# Patient Record
Sex: Male | Born: 1965 | Race: White | Hispanic: No | Smoking: Never smoker
Health system: Southern US, Community
[De-identification: ages and names within clinical notes are randomized; demographics above are authoritative.]

## PROBLEM LIST (undated history)

## (undated) DIAGNOSIS — Z8719 Personal history of other diseases of the digestive system: Secondary | ICD-10-CM

## (undated) DIAGNOSIS — I10 Essential (primary) hypertension: Secondary | ICD-10-CM

## (undated) DIAGNOSIS — K219 Gastro-esophageal reflux disease without esophagitis: Secondary | ICD-10-CM

## (undated) DIAGNOSIS — E785 Hyperlipidemia, unspecified: Secondary | ICD-10-CM

## (undated) DIAGNOSIS — I4892 Unspecified atrial flutter: Secondary | ICD-10-CM

## (undated) DIAGNOSIS — E669 Obesity, unspecified: Secondary | ICD-10-CM

## (undated) DIAGNOSIS — M1990 Unspecified osteoarthritis, unspecified site: Secondary | ICD-10-CM

## (undated) HISTORY — DX: Obesity, unspecified: E66.9

## (undated) HISTORY — DX: Unspecified atrial flutter: I48.92

## (undated) HISTORY — DX: Hyperlipidemia, unspecified: E78.5

---

## 2005-05-01 ENCOUNTER — Inpatient Hospital Stay (HOSPITAL_COMMUNITY): Admission: EM | Admit: 2005-05-01 | Discharge: 2005-05-03 | Payer: Self-pay | Admitting: Emergency Medicine

## 2005-05-06 ENCOUNTER — Ambulatory Visit (HOSPITAL_COMMUNITY): Admission: RE | Admit: 2005-05-06 | Discharge: 2005-05-06 | Payer: Self-pay | Admitting: Cardiovascular Disease

## 2006-06-03 ENCOUNTER — Encounter: Admission: RE | Admit: 2006-06-03 | Discharge: 2006-06-03 | Payer: Self-pay | Admitting: Internal Medicine

## 2006-06-04 ENCOUNTER — Encounter: Admission: RE | Admit: 2006-06-04 | Discharge: 2006-06-04 | Payer: Self-pay | Admitting: Internal Medicine

## 2008-05-25 ENCOUNTER — Ambulatory Visit: Payer: Self-pay | Admitting: Internal Medicine

## 2009-07-05 ENCOUNTER — Ambulatory Visit: Payer: Self-pay | Admitting: Internal Medicine

## 2009-08-23 ENCOUNTER — Ambulatory Visit: Payer: Self-pay | Admitting: Internal Medicine

## 2010-01-09 ENCOUNTER — Ambulatory Visit: Payer: Self-pay | Admitting: Internal Medicine

## 2010-09-29 NOTE — H&P (Signed)
[name], John Garcia NO.:  [id number]   MEDICAL RECORD NO.:  [id number]          PATIENT TYPE:  EMS   LOCATION:  MAJO                         FACILITY:  [facility]   PHYSICIAN:  Ulyses Amor, MD DATE OF BIRTH:  03-23-1966   DATE OF ADMISSION:  04/30/2005  DATE OF DISCHARGE:                                HISTORY & PHYSICAL   INCOMPLETE DICTATION:   HISTORY OF PRESENT ILLNESS:  John Garcia is a 45 year old white man who  is admitted to [facility] for the further management of new onset  atrial fibrillation/atrial flutter.   The patient, who has no past history of cardiac disease, experienced the  sudden onset of a rapid and irregular heart beat at approximately 5 p.m.  today.  There was no associated chest pain, tightness, heaviness, pressure  or dyspnea, nor was there any dizziness, lightheadedness, syncope, or near  syncope.  He ultimately presented to the emergency department where he was  found to be in atrial flutter/atrial fibrillation with a rapid ventricular  rate.  Treatment was initiated with intravenous Diltiazem.  At this time, he  remains in atrial fibrillation, but his ventricular rate is in the 80s.   As noted, the patient has no past history of cardiac disease including no  history of myocardial infarction, coronary artery disease, congestive heart  failure, or arrhythmia.  He has occasional   Dictation ended at this point.      Ulyses Amor, MD     MSC/MEDQ  D:  04/30/2005  T:  05/01/2005  Job:  [id number]

## 2010-09-29 NOTE — Cardiovascular Report (Signed)
NAMESTEPHAUN, John Garcia             ACCOUNT NO.:  [id number]   MEDICAL RECORD NO.:  [id number]          PATIENT TYPE:  INP   LOCATION:  4737                         FACILITY:  [facility]   PHYSICIAN:  Darlin Priestly, MD  DATE OF BIRTH:  11-Dec-1965   DATE OF PROCEDURE:  05/02/2005  DATE OF DISCHARGE:                              CARDIAC CATHETERIZATION   PROCEDURES:  1.  Left heart catheterization.  2.  Coronary angiography.  3.  Left ventriculogram.  4.  Abdominal aortogram.   ATTENDING:  Darlin Priestly, M.D.   COMPLICATIONS:  None.   INDICATIONS:  Mr. Cooprider is a 45 year old male who was admitted on April 30, 2005, with new-onset atrial fibrillation.  He did have rapid ventricular  response, treated with IV Cardizem, though he did have some three and four-  second pauses.  He is now brought for cardiac catheterization to rule out  CAD prior to initiation of Coumadin therapy.   DESCRIPTION OF PROCEDURE:  After giving informed written consent, the  patient was brought to the cardiac catheterization lab.  The right groin was  shaved, prepped and draped in the usual sterile fashion.  Anesthesia  monitoring was established.  Using modified Seldinger technique, a #6 French  arterial sheath via the right femoral artery.  Six French diagnostic  catheters then performed diagnostic angiography.   1.  The left main is a large vessel with no significant disease.  2.  The LAD is a medium to large-sized vessel which coursed to the apex with      its two diagonal branches.  The LAD has no significant disease.  3.  First and second diagonals are medium-sized vessels with no significant      disease.  4.  Left circumflex is a medium-sized vessel that coursed in the AV groove      and gives rise to two obtuse marginal branches.  The AV groove      circumflex has no significant disease.  5.  The first OM is a medium-sized vessel with no significant disease.  6.  The second OM is a large  vessel which bifurcates in its proximal      segment, with no significant disease.  7.  The right coronary artery is a large vessel which is dominant and gives      rise to a PDA as well as a posterolateral branch.  There is no      significant disease in the RCA, PDA or posterolateral branch.   Left ventriculogram reveals a mildly depressed EF of 40-45% with mild global  hypokinesis.  The patient is in rapid atrial fibrillation.   Abdominal aortogram reveals no evidence of significant renal artery  stenosis.   HEMODYNAMICS:  Systemic arterial pressure 130/90, LV systemic pressure  134/6, LVEDP of 15.   CONCLUSION:  1.  No significant coronary artery disease.  2.  Mildly depressed left ventricular systolic function.  3.  No evidence of renal artery stenosis.  4.  Systemic hypertension.      Darlin Priestly, MD  Electronically Signed     RHM/MEDQ  D:  05/02/2005  T:  05/04/2005  Job:  [id number]   cc:   Nanetta Batty, M.D.  Fax: [phone]

## 2010-09-29 NOTE — Discharge Summary (Signed)
John Garcia, John Garcia             ACCOUNT NO.:  [id number]   MEDICAL RECORD NO.:  [id number]          PATIENT TYPE:  INP   LOCATION:  4737                         FACILITY:  [facility]   PHYSICIAN:  Nanetta Batty, M.D.   DATE OF BIRTH:  02-21-1966   DATE OF ADMISSION:  04/30/2005  DATE OF DISCHARGE:                                 DISCHARGE SUMMARY   DISCHARGE DIAGNOSIS:  1.  Paroxysmal atrial flutter, sinus rhythm at discharge.  2.  Family history of coronary disease, normal coronaries at catheterization      this admission.  3.  Mild LV dysfunction with an EF of 45%.   HOSPITAL COURSE:  The patient is a 45 year old male who was admitted by Dr.  Effie Shy with tachycardia. He apparently has had fatigue and tachycardia for  the last month or so. He admits to a high caffeine intake, 3 Mountain Dew's  a day and recently has been taking over-the-counter decongestants. On  admission he was in rapid atrial flutter. He is admitted to telemetry, put  on IV heparin, started on rate control medications, including a beta blocker  and Cardizem. He did have some pauses of up to 4.3 seconds; and we had to  titrate his Cardizem down some. He does have a strong family history of  coronary disease, his father had an MI at an early age. Before he was placed  on Coumadin, Dr. Allyson Sabal want to a diagnostic catheterization; and this was  done on 05/02/2005 by Dr. Jenne Campus. This revealed normal coronaries with an  EF of 45%. Coumadin was started. We were going to do a TE cardioversion on  05/04/2005, but the patient converted spontaneously to sinus rhythm on the  evening of to 05/02/2005. We feel he can be discharged on 05/03/2005.   DISCHARGE MEDICATIONS:  1.  Lovenox 120 mg twice a day.  2.  BuSpar 10 mg twice a day as needed for nerves.  3.  Cardizem CD 180 daily.  4.  Prilosec over-the-counter daily.  5.  Coumadin 5 mg 1-1/2 tablets a day or as directed.  6.  Metoprolol 25 mg twice a day.   LABS:   Sodium 139, potassium 3.9, BUN 10, creatinine 1.0, INR 1.0 at  discharge. White count 7.8, hemoglobin 15.8, hematocrit 45.2, platelets 201.  Liver functions were normal. Enzymes were negative. TSH is 2.18.   CHEST X-RAY:  Shows no active disease. EKG on admission shows atrial  flutter.   DISPOSITION:  The patient is discharged in stable condition. He will have a  ProTime checked Sunday; and that will be called to the on call PA. He will  need an INR goal of 2-3. 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  Dr. Alanda Amass felt that the patient's atrial flutter could be attributed to  excess caffeine intake; and over-the-counter decongestant intake. Hopefully  he will be able to come off Coumadin at some point in the future. This will  be determined by Dr. Allyson Sabal.      Abelino Derrick, P.A.  Nanetta Batty, M.D.  Electronically Signed    LKK/MEDQ  D:  05/03/2005  T:  05/05/2005  Job:  [id number]

## 2010-09-29 NOTE — H&P (Signed)
John Garcia, HEARNE NO.:  [id number]   MEDICAL RECORD NO.:  [id number]          PATIENT TYPE:  EMS   LOCATION:  MAJO                         FACILITY:  [facility]   PHYSICIAN:  Ulyses Amor, MD DATE OF BIRTH:  November 29, 1965   DATE OF ADMISSION:  04/30/2005  DATE OF DISCHARGE:                                HISTORY & PHYSICAL   Isador Castille is a 45 year old white man who is admitted to [facility] for the further management of new onset atrial fibrillation and  atrial flutter.   The patient, who has no past history of cardiac disease, developed the  sudden onset of a rapid and irregular heartbeat at approximately 5:00 p.m.  He was at rest at the time. There was no associated dyspnea, diaphoresis,  dizziness, lightheadedness, syncope, or near syncope. Nor was there any  chest pain, tightness, heaviness, pressure, or squeezing. He ultimately  presented to the emergency department where he was found to be in atrial  fibrillation/atrial flutter with a rapid ventricular rate. Treatment was  initiated with Diltiazem IV and his heart rate had subsequently dropped to  the 80s so he remains in atrial fibrillation.   As noted, the patient has no past history of cardiac disease, including no  history of myocardial infarction, coronary artery disease, congestive heart  failure, or arrhythmia. He does have occasional but rare fleeting sharp  pains in his left anterior chest. He mentioned this to his primary care  physician but no further evaluation was performed. These episodes occur at  random and appear not to be related to position, activity, meals or  respiration. There were no exacerbating or ameliorating factors.   The patient has no risk factors for coronary artery disease, including no  history of hypertension, dyslipidemia, diabetes mellitus, smoking, or family  history of early coronary artery disease.   The patient's past medical history is otherwise  unremarkable.   MEDICATIONS:  None.   ALLERGIES:  None.   OPERATIONS:  None.   SIGNIFICANT INJURIES:  None.   Family history is notable for diabetes and hypertension in his mother.   SOCIAL:  The patient lives with his wife. He works Programmer, applications. He  drinks occasional alcohol. He smokes no cigarettes. He uses no illicit  drugs.   Review of systems reveals no new problems. No problems related to his head,  eyes, ears, nose, mouth, throat, lungs, gastrointestinal system,  genitourinary system or extremities. There is no history of neurologic or  psychiatric disorder. There is no history of fever, chills, or weight loss.   PHYSICAL EXAMINATION:  Blood pressure 126/72, pulse 79 and irregularly  irregular, respirations 26, temperature 97.8, pulse ox 99% on room air. The  patient was an obese, middle-aged white man in no discomfort. He was alert,  oriented, appropriate and responsive.  HEAD, EYES, NOSE, MOUTH: Normal.  NECK: Without thyromegaly or adenopathy. Carotid pulses were palpable  bilaterally and without bruits.  CARDIAC: Revealed an irregularly irregular rhythm. There was no gallop,  murmur, rub or click. No chest wall tenderness was noted.  LUNGS: Clear.  ABDOMEN: Soft  and nontender. There was no mass, hepatosplenomegaly, bruit,  distention, rebound, guarding, rigidity. Bowel sounds were normal.  RECTAL/GENITAL: Not performed as they were not pertinent to the reason for  acute care hospitalization.  EXTREMITIES: Without edema, deviation, or deformity. Radial and dorsalis  pedal pulses were palpable bilaterally.  NEUROLOGIC: Brief screening neurologic survey was unremarkable.   The initial electrocardiograms revealed atrial flutter with 2:1 AV  conduction and a ventricular rate of 140 beats per minute. Subsequent  telemetry monitoring revealed atrial fibrillation. The chest radiograph,  according to the radiologist, demonstrated borderline cardiomegaly, mild to   moderate bronchitic changes, and mild chronic interstitial lung disease.  Mild interstitial lung disease. The initial set of cardiac markers revealed  the myoglobin of 54.3, CK-MB 1.2 and troponin less than 0.05. The second set  of cardiac markers revealed myoglobin of 55.6, CK-MB less than 1 and  troponin less than 0.05. Potassium is 3.6, BUN 12, and creatinine 0.9. The  remaining studies were pending at the time of this dictation.   IMPRESSION:  1.  New onset atrial fibrillation and atrial flutter with rapid ventricular      rate.  2.  Chest pain.  3.  Chronic interstitial lung changes.   PLAN:  1.  Telemetry.  2.  Serial cardiac enzymes.  3.  Aspirin.  4.  Intravenous heparin.  5.  Intravenous diltiazem.  6.  Thyroid function tests.  7.  Fasting lipid profile.  8.  Echocardiogram.  9.  Further measures per Dr. Tresa Endo.      Ulyses Amor, MD  Electronically Signed     MSC/MEDQ  D:  04/30/2005  T:  05/01/2005  Job:  [id number]   cc:   Ulyses Amor, MD  Fax: [phone]   Nicki Guadalajara, M.D.  Fax: [phone]

## 2011-01-05 ENCOUNTER — Encounter: Payer: Self-pay | Admitting: Internal Medicine

## 2011-01-09 ENCOUNTER — Encounter: Payer: Self-pay | Admitting: Internal Medicine

## 2011-01-09 ENCOUNTER — Ambulatory Visit (INDEPENDENT_AMBULATORY_CARE_PROVIDER_SITE_OTHER): Payer: BC Managed Care – PPO | Admitting: Internal Medicine

## 2011-01-09 VITALS — BP 122/76 | HR 76 | Temp 97.8°F | Ht 75.0 in | Wt 247.0 lb

## 2011-01-09 DIAGNOSIS — Z0000 Encounter for general adult medical examination without abnormal findings: Secondary | ICD-10-CM

## 2011-01-09 DIAGNOSIS — E785 Hyperlipidemia, unspecified: Secondary | ICD-10-CM

## 2011-01-09 LAB — POCT URINALYSIS DIPSTICK
Bilirubin, UA: NEGATIVE
Blood, UA: NEGATIVE
Ketones, UA: NEGATIVE
Leukocytes, UA: NEGATIVE
Spec Grav, UA: 1.015
pH, UA: 5

## 2011-01-10 ENCOUNTER — Encounter: Payer: Self-pay | Admitting: Internal Medicine

## 2011-01-10 DIAGNOSIS — E785 Hyperlipidemia, unspecified: Secondary | ICD-10-CM | POA: Insufficient documentation

## 2011-01-10 LAB — CBC WITH DIFFERENTIAL/PLATELET
Basophils Absolute: 0 10*3/uL (ref 0.0–0.1)
Eosinophils Absolute: 0.1 10*3/uL (ref 0.0–0.7)
Eosinophils Relative: 2 % (ref 0–5)
MCH: 32.1 pg (ref 26.0–34.0)
MCV: 98.4 fL (ref 78.0–100.0)
Neutrophils Relative %: 57 % (ref 43–77)
Platelets: 174 10*3/uL (ref 150–400)
RDW: 12.8 % (ref 11.5–15.5)
WBC: 6.3 10*3/uL (ref 4.0–10.5)

## 2011-01-10 LAB — COMPREHENSIVE METABOLIC PANEL
ALT: 27 U/L (ref 0–53)
AST: 21 U/L (ref 0–37)
Alkaline Phosphatase: 73 U/L (ref 39–117)
Creat: 0.83 mg/dL (ref 0.50–1.35)
Sodium: 142 mEq/L (ref 135–145)
Total Bilirubin: 0.5 mg/dL (ref 0.3–1.2)

## 2011-01-10 LAB — LIPID PANEL
Cholesterol: 268 mg/dL — ABNORMAL HIGH (ref 0–200)
HDL: 47 mg/dL (ref >39)
Total CHOL/HDL Ratio: 5.7 Ratio
VLDL: 37 mg/dL (ref 0–40)

## 2011-01-10 NOTE — Progress Notes (Signed)
Subjective:    Patient ID: John Garcia, male    DOB: [birth year], 45 y.o.   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 Last physical exam April 2011. He has a history of hyperlipidemia used to take Crestor 10 mg daily but he says it made his legs hurt. In February 2011 he was going through a separation and has been subsequently divorced from his first wife. At that time he lost down to 229 pounds. He gained up to 233 pounds by April 2011 and now weighs 247 pounds. His father is morbidly obese. Mother is overweight as well.  Patient was admitted to the hospital December 2006 with new onset atrial fibrillation with rapid ventricular response treated with IV Cardizem with 3-4 second pauses. Patient subsequently converted with IV Cardizem. He had a cardiac catheterization which showed no significant coronary artery disease. He currently is not on any antiarrhythmic medication nor on Coumadin. He's never had a further episode.  History of left lower pneumonia 2008. 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  Social history: Does not , social alcohol consumption; divorced with 2 daughters oldest is 8 years old.  Family history: Mother with diabetes hypertension and hyperlipidemia; father with coronary artery disease, history of melanoma, hypertension, morbid obesity, sleep apnea. One sister in good health.    Review of Systems  Constitutional: Negative.   HENT: Negative.   Eyes: Negative.   Respiratory: Negative.   Cardiovascular: Negative.   Gastrointestinal: Negative.   Genitourinary: Negative.   Musculoskeletal: Negative.   Neurological: Negative.   Hematological: Negative.   Psychiatric/Behavioral: Negative.        Objective:   Physical Exam  Constitutional: He is oriented to person, place, and time. He appears well-nourished. No distress.  HENT:  Head: Normocephalic.  Right Ear:  External ear normal.  Left Ear: External ear normal.  Eyes: EOM are normal. Pupils are equal, round, and reactive to light. Right eye exhibits no discharge. Left eye exhibits no discharge. No scleral icterus.  Neck: Normal range of motion. Neck supple. No JVD present. No thyromegaly present.  Cardiovascular: Normal rate, regular rhythm and normal heart sounds.  Exam reveals no gallop.   No murmur heard. Pulmonary/Chest: No respiratory distress. He has no wheezes. He has no rales. He exhibits no tenderness.  Abdominal: Soft. Bowel sounds are normal. He exhibits no distension and no mass. There is no rebound and no guarding.  Genitourinary: Prostate normal.  Musculoskeletal: He exhibits no tenderness.  Lymphadenopathy:    He has no cervical adenopathy.  Neurological: He is alert and oriented to person, place, and time. He displays normal reflexes. No cranial nerve deficit. Coordination normal.  Skin: No rash noted.  Psychiatric: He has a normal mood and affect. His behavior is normal. Judgment and thought content normal.          Assessment & Plan:  Hyperlipidemia  Weight gain of 14 pounds since April 2011  Patient previously took Crestor 5 mg daily. I am suggested he try Crestor 5 mg daily since he complained that 10 mg made his legs hurt. He will return in 8 weeks or so to have fasting lipid panel and liver function strong without office visit. With his family history, think he is likely to need long-term statin therapy if he can tolerate it. Needs to have an EpiPen on hand for bee stings. This will be prescribed. Also there is a dilated vessel on his  right nose that looks like it runs into a small hemangioma. He has had this previously evaluated by Dr. Karlyn Agee. Says it bleeds intermittently. We will have Dr. Yetta Barre reevaluate this. He also needs a tetanus immunization update at next visit.

## 2011-01-12 ENCOUNTER — Telehealth: Payer: Self-pay | Admitting: Internal Medicine

## 2011-01-12 NOTE — Telephone Encounter (Signed)
Advised patient of appt with Dr. Karlyn Agee at [facility] on Wednesday, 01/24/11 at 12:20 p.m. Pt verbalized understanding.

## 2011-02-13 ENCOUNTER — Other Ambulatory Visit: Payer: BC Managed Care – PPO | Admitting: Internal Medicine

## 2011-02-13 ENCOUNTER — Ambulatory Visit (INDEPENDENT_AMBULATORY_CARE_PROVIDER_SITE_OTHER): Payer: BC Managed Care – PPO | Admitting: Internal Medicine

## 2011-02-13 DIAGNOSIS — Z23 Encounter for immunization: Secondary | ICD-10-CM

## 2011-02-13 DIAGNOSIS — E785 Hyperlipidemia, unspecified: Secondary | ICD-10-CM

## 2011-02-13 MED ORDER — TETANUS-DIPHTH-ACELL PERTUSSIS 5-2.5-18.5 LF-MCG/0.5 IM SUSP
0.5000 mL | Freq: Once | INTRAMUSCULAR | Status: AC
  Administered 2011-02-13: 0.5 mL via INTRAMUSCULAR

## 2011-02-14 LAB — LIPID PANEL
HDL: 52 mg/dL (ref >39)
LDL Cholesterol: 162 mg/dL — ABNORMAL HIGH (ref 0–99)
Total CHOL/HDL Ratio: 4.5 Ratio
VLDL: 20 mg/dL (ref 0–40)

## 2011-02-14 LAB — HEPATIC FUNCTION PANEL
AST: 16 U/L (ref 0–37)
Albumin: 4.5 g/dL (ref 3.5–5.2)
Total Bilirubin: 0.4 mg/dL (ref 0.3–1.2)

## 2011-02-15 ENCOUNTER — Encounter: Payer: Self-pay | Admitting: Internal Medicine

## 2011-04-17 ENCOUNTER — Ambulatory Visit
Admission: RE | Admit: 2011-04-17 | Discharge: 2011-04-17 | Disposition: A | Discharge status: Home / Self Care | Payer: BC Managed Care – PPO | Source: Ambulatory Visit | Attending: Internal Medicine | Admitting: Internal Medicine

## 2011-04-17 ENCOUNTER — Ambulatory Visit (INDEPENDENT_AMBULATORY_CARE_PROVIDER_SITE_OTHER): Payer: BC Managed Care – PPO | Admitting: Internal Medicine

## 2011-04-17 ENCOUNTER — Encounter: Payer: Self-pay | Admitting: Internal Medicine

## 2011-04-17 VITALS — BP 126/82 | HR 80 | Temp 100.4°F | Wt 247.0 lb

## 2011-04-17 DIAGNOSIS — R059 Cough, unspecified: Secondary | ICD-10-CM

## 2011-04-17 DIAGNOSIS — J40 Bronchitis, not specified as acute or chronic: Secondary | ICD-10-CM

## 2011-04-17 DIAGNOSIS — R05 Cough: Secondary | ICD-10-CM

## 2011-04-17 DIAGNOSIS — J111 Influenza due to unidentified influenza virus with other respiratory manifestations: Secondary | ICD-10-CM

## 2011-04-17 DIAGNOSIS — R509 Fever, unspecified: Secondary | ICD-10-CM

## 2011-04-17 LAB — CBC WITH DIFFERENTIAL/PLATELET
HCT: 47 % (ref 39.0–52.0)
Lymphocytes Relative: 16 % (ref 12–46)
Monocytes Absolute: 0.4 10*3/uL (ref 0.1–1.0)
Neutro Abs: 7.1 10*3/uL (ref 1.7–7.7)
Platelets: 140 10*3/uL — ABNORMAL LOW (ref 150–400)
RBC: 4.97 MIL/uL (ref 4.22–5.81)
RDW: 11.9 % (ref 11.5–15.5)
WBC: 8.9 10*3/uL (ref 4.0–10.5)

## 2011-04-17 MED ORDER — CEFTRIAXONE SODIUM 1 G IJ SOLR
1.0000 g | Freq: Once | INTRAMUSCULAR | Status: AC
  Administered 2011-04-17: 1 g via INTRAMUSCULAR

## 2011-04-17 NOTE — Progress Notes (Signed)
  Subjective:    Patient ID: John Garcia, male    DOB: [birth year], 45 y.o.   MRN: [id number]  HPI patient has cough, congestion, fever, myalgias, chills, malaise and fatigue. Has been symptomatic for 4 days. Has sputum production but doesn't know what color his sputum is. Has lack of appetite. Has tried to continue working despite being ill. 2 daughters have had some respiratory infection but not frank flu.    Review of Systems     Objective:   Physical Exam HEENT exam TMs are full and dull bilaterally, pharynx is slightly injected, neck is supple without significant adenopathy, chest: Has coarse breath sounds and some wheezing with deep inspiration left lower chest. Chest x-ray shows no infiltrate. CBC is within normal limits.        Assessment & Plan:  Clinically he has early pneumonia although it doesn't show up on chest x-ray. He definitely has bronchitis. He also has influenza. It is too late to prescribe Tamiflu for him since he's been ill for 4 days. Plan he was given 1 g IM Rocephin in the office. Started on Levaquin 750 mg daily for 10 days. Hycodan 8 ounces 1 teaspoon by mouth every 6 hours when necessary cough. Advised not to work for several days until he feels better. Drink plenty of fluids and stay well-hydrated. Return when necessary

## 2011-04-18 NOTE — Patient Instructions (Signed)
Take Levaquin 750 mg daily for 10 days. Take Hycodan as needed every 6 hours for cough. Drink plenty of fluids. Please try not to work over the next several days and get some rest. Call if symptoms worsen

## 2011-06-29 ENCOUNTER — Telehealth: Payer: Self-pay | Admitting: Internal Medicine

## 2011-06-29 MED ORDER — ROSUVASTATIN CALCIUM 10 MG PO TABS: 10.0000 mg | ORAL_TABLET | Freq: Every day | ORAL | Status: DC

## 2011-06-29 NOTE — Telephone Encounter (Signed)
rx for crestor refilled

## 2011-07-02 ENCOUNTER — Other Ambulatory Visit: Payer: BC Managed Care – PPO | Admitting: Internal Medicine

## 2011-07-03 ENCOUNTER — Ambulatory Visit: Payer: BC Managed Care – PPO | Admitting: Internal Medicine

## 2012-03-24 ENCOUNTER — Other Ambulatory Visit: Payer: Self-pay

## 2012-03-24 MED ORDER — ROSUVASTATIN CALCIUM 10 MG PO TABS: 10.0000 mg | ORAL_TABLET | Freq: Every day | ORAL | Status: DC

## 2012-04-07 ENCOUNTER — Other Ambulatory Visit: Payer: Self-pay | Admitting: Internal Medicine

## 2012-07-01 ENCOUNTER — Ambulatory Visit (INDEPENDENT_AMBULATORY_CARE_PROVIDER_SITE_OTHER): Payer: BC Managed Care – PPO | Admitting: Internal Medicine

## 2012-07-01 ENCOUNTER — Encounter: Payer: Self-pay | Admitting: Internal Medicine

## 2012-07-01 VITALS — BP 134/90 | HR 76 | Temp 97.7°F | Wt 259.0 lb

## 2012-07-01 DIAGNOSIS — J9801 Acute bronchospasm: Secondary | ICD-10-CM

## 2012-07-01 DIAGNOSIS — J209 Acute bronchitis, unspecified: Secondary | ICD-10-CM

## 2012-07-01 NOTE — Progress Notes (Signed)
  Subjective:    Patient ID: John Garcia, male    DOB: 1965/09/05, 47 y.o.   MRN: [id number]  HPI 47 year old white male with history of hyperlipidemia currently on Crestor in today with URI symptoms for several days. Yesterday had some wheezing and shortness of breath. Has had some cough with sputum production but he does not know the color of the sputum. No fever or shaking chills. One episode of bronchospasm associated with URI several years ago. He tried some leftover Amoxicillin twice daily for 5 days last week without much improvement. Did not take influenza immunization this season.  History of atrial fibrillation/flutter with rapid ventricular response December 2006 treated with IV Cardizem. A cardiac catheterization 05/02/2005 showing normal coronaries. Currently not on any medication for this and has never had a recurrence.  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  Family history: Mother with diabetes mellitus, hypertension, hyperlipidemia. Father with sleep apnea, hypertension, morbid obesity, coronary disease  Sister with hypertension  Social history: He is divorced. 2 daughters. He is a self-employed Leisure centre manager. Does not smoke. Social alcohol consumption.  Last physical exam 2012.    Review of Systems     Objective:   Physical Exam pulse oximetry on room air is within normal limits. HEENT exam: Pharynx slightly injected. TMs are chronically scarred but clear. Neck is supple without adenopathy. Chest clear without rales or wheezing. He has a deep congested cough. Skin is warm and dry.        Assessment & Plan:  Bronchitis  Bronchospasm  Hyperlipidemia-on Crestor schedule for physical exam in the near future  Plan: Sterapred DS 10 mg 6 day dosepak take as directed. Ventolin inhaler 2 sprays by mouth 4 times a day when necessary wheezing or shortness of breath. Biaxin 500 mg twice daily for 10 days with one refill. Has Hycodan syrup on hand for cough

## 2012-07-01 NOTE — Patient Instructions (Addendum)
Take Biaxin 500 mg twice daily for 10 days. Take prednisone tapering dosepak as prescribed for 6 days. Take Hycodan as needed for cough. Use Ventolin inhaler for shortness of breath or wheezing 4 times daily

## 2012-08-12 ENCOUNTER — Encounter: Payer: Self-pay | Admitting: Internal Medicine

## 2012-08-12 ENCOUNTER — Ambulatory Visit (INDEPENDENT_AMBULATORY_CARE_PROVIDER_SITE_OTHER): Payer: BC Managed Care – PPO | Admitting: Internal Medicine

## 2012-08-12 VITALS — BP 134/88 | HR 80 | Temp 98.4°F | Ht 64.75 in | Wt 258.0 lb

## 2012-08-12 DIAGNOSIS — S09302A Unspecified injury of left middle and inner ear, initial encounter: Secondary | ICD-10-CM

## 2012-08-12 DIAGNOSIS — E785 Hyperlipidemia, unspecified: Secondary | ICD-10-CM

## 2012-08-12 DIAGNOSIS — Z0000 Encounter for general adult medical examination without abnormal findings: Secondary | ICD-10-CM

## 2012-08-12 LAB — POCT URINALYSIS DIPSTICK
Bilirubin, UA: NEGATIVE
Blood, UA: NEGATIVE
Nitrite, UA: NEGATIVE
Urobilinogen, UA: NEGATIVE
pH, UA: 5.5

## 2012-08-12 LAB — COMPREHENSIVE METABOLIC PANEL
Alkaline Phosphatase: 81 U/L (ref 39–117)
BUN: 16 mg/dL (ref 6–23)
Glucose, Bld: 80 mg/dL (ref 70–99)
Total Bilirubin: 0.4 mg/dL (ref 0.3–1.2)

## 2012-08-12 LAB — LIPID PANEL
HDL: 56 mg/dL (ref >39)
LDL Cholesterol: 125 mg/dL — ABNORMAL HIGH (ref 0–99)
Triglycerides: 106 mg/dL (ref <150)
VLDL: 21 mg/dL (ref 0–40)

## 2012-08-12 LAB — CBC WITH DIFFERENTIAL/PLATELET
Basophils Relative: 0 % (ref 0–1)
Eosinophils Absolute: 0.1 10*3/uL (ref 0.0–0.7)
HCT: 45.8 % (ref 39.0–52.0)
Hemoglobin: 15.8 g/dL (ref 13.0–17.0)
MCH: 31.5 pg (ref 26.0–34.0)
MCHC: 34.5 g/dL (ref 30.0–36.0)
Monocytes Absolute: 0.5 10*3/uL (ref 0.1–1.0)
Monocytes Relative: 9 % (ref 3–12)

## 2012-08-12 NOTE — Patient Instructions (Addendum)
Take amoxicillin 500 mg 3 times daily for 10 days for eardrum injury. Apply Cortisporin Otic suspension and took left external ear canal 3-4 times daily for 5-7 days. Call if not better in 2 weeks. Continue Crestor for hyperlipidemia. Return in 6 months. Try to diet exercise and lose weight.

## 2013-02-03 ENCOUNTER — Encounter: Payer: Self-pay | Admitting: Internal Medicine

## 2013-02-03 NOTE — Progress Notes (Signed)
  Subjective:    Patient ID: John Garcia, male    DOB: 1966-04-07, 47 y.o.   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 In 2011, he was going through a separation and divorce.  At that time he lost down to 229 pounds. In 2012 he weighed 247 pounds. He's gained 11 pounds since then.  Patient was admitted to hospital December 2006 with new onset atrial fibrillation with rapid ventricular response treated with IV Cardizem. Patient subsequently converted to sinus rhythm. He had a cardiac catheterization that showed no significant coronary artery disease. He's never had a further episode  and is not on any antiarrhythmic medication.  History of left lower lobe pneumonia 2008.  He is allergic to bee stings.  Social history: Does not smoke, social alcohol consumption. Divorced with 2 daughters. Oldest daughter just finished high school . Youngest daughter is interested in barrel racing with her horse. Ex-wife is remarried. He has not remarried.  Family history: Mother with history of diabetes, hypertension, hyperlipidemia, obesity. Father with history of coronary artery disease, history of melanoma, hypertension, morbid obesity, sleep apnea. One sister with mild hypertension and migraine headache.  Recently he was riding a horse and some debris got into his right external ear canal. It is inflamed and tender.    Review of Systems  Constitutional: Negative.   HENT: Negative.   Eyes: Negative.   Respiratory: Negative.   Cardiovascular: Negative.   Gastrointestinal: Negative.   Endocrine: Negative.   Genitourinary: Negative.   Musculoskeletal: Positive for arthralgias.  Skin: Negative.   Hematological: Negative.   Psychiatric/Behavioral: Negative.        Objective:   Physical Exam  Vitals reviewed. Constitutional: He is oriented to person, place, and time. He appears well-developed and well-nourished. No distress.   HENT:  Head: Normocephalic and atraumatic.  Right Ear: External ear normal.  Small hemorrhage left tympanic membrane  Eyes: Conjunctivae and EOM are normal. Pupils are equal, round, and reactive to light. Right eye exhibits no discharge. Left eye exhibits no discharge. No scleral icterus.  Neck: Neck supple. No JVD present.  Cardiovascular: Normal rate, regular rhythm, normal heart sounds and intact distal pulses.   No murmur heard. Pulmonary/Chest: Effort normal and breath sounds normal. No respiratory distress. He has no wheezes. He has no rales. He exhibits no tenderness.  Abdominal: Soft. Bowel sounds are normal. He exhibits no distension. There is no tenderness. There is no rebound and no guarding.  Genitourinary: Prostate normal.  Musculoskeletal: He exhibits no edema.  Lymphadenopathy:    He has no cervical adenopathy.  Neurological: He is alert and oriented to person, place, and time. He has normal reflexes. No cranial nerve deficit.  Skin: Skin is warm and dry. No rash noted. He is not diaphoretic.  Psychiatric: He has a normal mood and affect. His behavior is normal. Judgment and thought content normal.          Assessment & Plan:  Hyperlipidemia-stable with statin medication  Obesity-family history of obesity. He needs to watch his weight.  History of atrial fibrillation one episode in 2006 with no further recurrence  Left ear drum trauma trauma-treat with Cortisporin Otic suspension for 5-7 days and amoxicillin 500 mg 3 times a day for 10 days  Plan: Return one year or as needed

## 2013-02-17 ENCOUNTER — Ambulatory Visit: Payer: BC Managed Care – PPO | Admitting: Internal Medicine

## 2013-03-10 ENCOUNTER — Ambulatory Visit (INDEPENDENT_AMBULATORY_CARE_PROVIDER_SITE_OTHER): Payer: BC Managed Care – PPO | Admitting: Internal Medicine

## 2013-03-10 ENCOUNTER — Encounter: Payer: Self-pay | Admitting: Internal Medicine

## 2013-03-10 VITALS — BP 110/78 | HR 80 | Temp 97.8°F | Ht 76.0 in | Wt 264.0 lb

## 2013-03-10 DIAGNOSIS — R079 Chest pain, unspecified: Secondary | ICD-10-CM

## 2013-03-10 DIAGNOSIS — M109 Gout, unspecified: Secondary | ICD-10-CM

## 2013-03-10 DIAGNOSIS — Z23 Encounter for immunization: Secondary | ICD-10-CM

## 2013-03-10 DIAGNOSIS — E785 Hyperlipidemia, unspecified: Secondary | ICD-10-CM

## 2013-03-10 DIAGNOSIS — Z79899 Other long term (current) drug therapy: Secondary | ICD-10-CM

## 2013-03-10 LAB — HEPATIC FUNCTION PANEL
Albumin: 4.3 g/dL (ref 3.5–5.2)
Alkaline Phosphatase: 77 U/L (ref 39–117)
Total Bilirubin: 0.5 mg/dL (ref 0.3–1.2)

## 2013-03-10 LAB — LIPID PANEL
HDL: 47 mg/dL (ref >39)
LDL Cholesterol: 78 mg/dL (ref 0–99)
VLDL: 34 mg/dL (ref 0–40)

## 2013-03-10 LAB — URIC ACID: Uric Acid, Serum: 6.9 mg/dL (ref 4.0–7.8)

## 2013-03-10 MED ORDER — ALLOPURINOL 300 MG PO TABS: 300.0000 mg | ORAL_TABLET | Freq: Every day | ORAL | Status: DC

## 2013-03-15 NOTE — Progress Notes (Signed)
  Subjective:    Patient ID: John Garcia, male    DOB: 1966-05-02, 47 y.o.   MRN: [id number]  HPI 47 year old male in today for followup on hyperlipidemia. He is on Crestor 10 mg daily. Fasting lipid panel and liver functions drawn. No complaints with side effects from Crestor. His been having some issues with swelling and redness of left foot first MTP joint. Thinks it may be gout. Plan to add uric acid to lab work. Still complaining of decreased hearing left ear. Also has had some left anterior chest pain. It was occurring at rest on one evening recently after a large meal. Did not radiate into neck or down left arm. No associated diaphoresis nausea or vomiting.    Review of Systems     Objective:   Physical Exam left TM chronically scarred but not red. Chest clear to auscultation. Cardiac exam regular rate and rhythm normal S1 and S2. Extremities without edema. Left foot first MTP joint slightly red but not swollen or hot to touch  EKG shows no acute changes      Assessment & Plan:  Hyperlipidemia-stable   Possible gout left foot first MTP joint  Probable noncardiac chest pain. If pain continues, refer back to cardiologist  Plan: Check uric acid level in addition to fasting lipid panel liver functions. Influenza vaccine given.  Addendum: Lipid panel within normal limits except for triglycerides of 172. Continue to watch diet. Continue Crestor. Uric acid level also normal. Were going to try allopurinol 300 mg daily and see if symptoms improve. Return in 6 months for physical exam.

## 2013-03-15 NOTE — Patient Instructions (Addendum)
If chest pain continues, please call us. Flu vaccine given. Continue Crestor. Watch diet. Try allopurinol to see if foot symptoms improve.

## 2013-06-24 ENCOUNTER — Other Ambulatory Visit: Payer: Self-pay | Admitting: Internal Medicine

## 2013-08-18 ENCOUNTER — Encounter: Payer: Self-pay | Admitting: Internal Medicine

## 2013-08-18 ENCOUNTER — Encounter: Payer: BC Managed Care – PPO | Admitting: Internal Medicine

## 2014-03-10 ENCOUNTER — Encounter: Payer: Self-pay | Admitting: Internal Medicine

## 2014-03-10 ENCOUNTER — Ambulatory Visit (INDEPENDENT_AMBULATORY_CARE_PROVIDER_SITE_OTHER): Payer: BC Managed Care – PPO | Admitting: Internal Medicine

## 2014-03-10 VITALS — BP 148/90 | HR 71 | Temp 97.8°F | Wt 261.0 lb

## 2014-03-10 DIAGNOSIS — M25519 Pain in unspecified shoulder: Secondary | ICD-10-CM

## 2014-03-10 MED ORDER — METHYLPREDNISOLONE ACETATE 80 MG/ML IJ SUSP
80.0000 mg | Freq: Once | INTRAMUSCULAR | Status: AC
  Administered 2014-03-10: 80 mg via INTRAMUSCULAR

## 2014-03-10 MED ORDER — HYDROCODONE-ACETAMINOPHEN 10-325 MG PO TABS: 1.0000 | ORAL_TABLET | Freq: Three times a day (TID) | ORAL | Status: DC | PRN

## 2014-03-10 MED ORDER — PREDNISONE 10 MG PO KIT: PACK | ORAL | Status: DC

## 2014-03-10 NOTE — Patient Instructions (Signed)
Take prednisone as directed. Take Hydrocodone APAP as directed.

## 2014-03-10 NOTE — Progress Notes (Signed)
   Subjective:    Patient ID: John Garcia, male    DOB: 1965-08-27, 48 y.o.   MRN: [id number]  HPI  In today with his girlfriend, Dorisann FramesJamie Dunn, to discuss bilateral shoulder pain. Having issues with both shoulders. Has good range of motion but chronic pain. He has a history of hyperlipidemia. Currently not taking Crestor.    Review of Systems     Objective:   Physical Exam  Good range of motion in his shoulders. No distinct crepitus. Deep tendon reflexes 2+ and symmetrical in the upper extremities. Muscle strength in the upper extremities is normal.      Assessment & Plan:  Bilateral shoulder arthropathy  Plan: Each shoulder was injected with Depo-Medrol, Marcaine and Xylocaine. If symptoms persist, he needs to see orthopedist regarding possible torn rotator cuff. Recently started on phentermine by another physician for weight loss. Do not think this is a good idea with his history of atrial fib with rapid ventricular response in 2006 which is not recurred.  He will take a course of oral prednisone and was given hydrocodone/APAP for pain

## 2014-03-15 ENCOUNTER — Other Ambulatory Visit: Payer: Self-pay

## 2014-03-15 MED ORDER — PREDNISONE 10 MG PO KIT: PACK | ORAL | Status: DC

## 2014-12-31 ENCOUNTER — Encounter: Payer: Self-pay | Admitting: Internal Medicine

## 2014-12-31 ENCOUNTER — Ambulatory Visit (INDEPENDENT_AMBULATORY_CARE_PROVIDER_SITE_OTHER): Payer: BLUE CROSS/BLUE SHIELD | Admitting: Internal Medicine

## 2014-12-31 ENCOUNTER — Other Ambulatory Visit: Payer: Self-pay | Admitting: Internal Medicine

## 2014-12-31 VITALS — BP 138/82 | HR 75 | Temp 98.0°F | Wt 259.0 lb

## 2014-12-31 DIAGNOSIS — E785 Hyperlipidemia, unspecified: Secondary | ICD-10-CM

## 2014-12-31 DIAGNOSIS — I10 Essential (primary) hypertension: Secondary | ICD-10-CM | POA: Diagnosis not present

## 2014-12-31 DIAGNOSIS — Z0000 Encounter for general adult medical examination without abnormal findings: Secondary | ICD-10-CM | POA: Diagnosis not present

## 2014-12-31 DIAGNOSIS — M791 Myalgia: Secondary | ICD-10-CM

## 2014-12-31 DIAGNOSIS — M7918 Myalgia, other site: Secondary | ICD-10-CM

## 2014-12-31 DIAGNOSIS — Z8679 Personal history of other diseases of the circulatory system: Secondary | ICD-10-CM

## 2014-12-31 DIAGNOSIS — R5383 Other fatigue: Secondary | ICD-10-CM

## 2014-12-31 LAB — LIPID PANEL
CHOLESTEROL: 296 mg/dL — AB (ref 125–200)
HDL: 50 mg/dL (ref >=40)
LDL Cholesterol: 209 mg/dL — ABNORMAL HIGH (ref <130)
TRIGLYCERIDES: 186 mg/dL — AB (ref <150)
Total CHOL/HDL Ratio: 5.9 Ratio — ABNORMAL HIGH (ref <=5.0)
VLDL: 37 mg/dL — ABNORMAL HIGH (ref <30)

## 2014-12-31 LAB — COMPLETE METABOLIC PANEL WITH GFR
ALT: 27 U/L (ref 9–46)
AST: 18 U/L (ref 10–40)
Albumin: 4.5 g/dL (ref 3.6–5.1)
Alkaline Phosphatase: 95 U/L (ref 40–115)
BUN: 24 mg/dL (ref 7–25)
CHLORIDE: 104 mmol/L (ref 98–110)
CO2: 25 mmol/L (ref 20–31)
CREATININE: 0.89 mg/dL (ref 0.60–1.35)
Calcium: 9.3 mg/dL (ref 8.6–10.3)
GLUCOSE: 86 mg/dL (ref 65–99)
Potassium: 4.7 mmol/L (ref 3.5–5.3)
SODIUM: 138 mmol/L (ref 135–146)
Total Bilirubin: 0.5 mg/dL (ref 0.2–1.2)
Total Protein: 7.4 g/dL (ref 6.1–8.1)

## 2014-12-31 LAB — CBC WITH DIFFERENTIAL/PLATELET
BASOS ABS: 0 10*3/uL (ref 0.0–0.1)
Basophils Relative: 0 % (ref 0–1)
Eosinophils Absolute: 0.2 10*3/uL (ref 0.0–0.7)
Eosinophils Relative: 2 % (ref 0–5)
HEMATOCRIT: 46.9 % (ref 39.0–52.0)
HEMOGLOBIN: 16.1 g/dL (ref 13.0–17.0)
LYMPHS PCT: 34 % (ref 12–46)
Lymphs Abs: 2.7 10*3/uL (ref 0.7–4.0)
MCH: 32 pg (ref 26.0–34.0)
MCHC: 34.3 g/dL (ref 30.0–36.0)
MCV: 93.2 fL (ref 78.0–100.0)
MPV: 10.6 fL (ref 8.6–12.4)
Monocytes Absolute: 0.6 10*3/uL (ref 0.1–1.0)
Monocytes Relative: 8 % (ref 3–12)
NEUTROS ABS: 4.5 10*3/uL (ref 1.7–7.7)
NEUTROS PCT: 56 % (ref 43–77)
Platelets: 206 10*3/uL (ref 150–400)
RBC: 5.03 MIL/uL (ref 4.22–5.81)
RDW: 13.3 % (ref 11.5–15.5)
WBC: 8 10*3/uL (ref 4.0–10.5)

## 2014-12-31 MED ORDER — LOSARTAN POTASSIUM 50 MG PO TABS: 50.0000 mg | ORAL_TABLET | Freq: Every day | ORAL | Status: DC

## 2015-01-01 LAB — T4, FREE: Free T4: 1.16 ng/dL (ref 0.80–1.80)

## 2015-01-01 LAB — TSH: TSH: 1.786 u[IU]/mL (ref 0.350–4.500)

## 2015-01-03 ENCOUNTER — Telehealth: Payer: Self-pay | Admitting: *Deleted

## 2015-01-03 LAB — HEMOGLOBIN A1C
Hgb A1c MFr Bld: 5.2 % (ref <5.7)
MEAN PLASMA GLUCOSE: 103 mg/dL (ref <117)

## 2015-01-03 MED ORDER — ATORVASTATIN CALCIUM 20 MG PO TABS
20.0000 mg | ORAL_TABLET | Freq: Every day | ORAL | Status: DC
Start: 2015-01-03 — End: 2015-03-18

## 2015-01-03 MED ORDER — COENZYME Q10 30 MG PO CAPS
30.0000 mg | ORAL_CAPSULE | Freq: Every day | ORAL | Status: DC
Start: 2015-01-03 — End: 2016-07-25

## 2015-01-03 NOTE — Progress Notes (Signed)
Subjective:    Patient ID: John Garcia, male    DOB: 10-26-1965, 49 y.o.   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 It improved after he rested there for sure. If time. Says it's been running high for 4- 5 months. For several months, he was taking phentermine to lose weight. He has stopped taking it and has gained back approximately 10 pounds he says. I am not sure who prescribed phentermine for him. I did not.  Patient has a history of atrial fibrillation with rapid ventricular response in 2006. He was hospitalized and treated with IV Cardizem. He subsequently converted to sinus rhythm. He had a cardiac catheterization that showed no significant coronary artery disease. He's never had a further episode and is not on anti-arrhythmic medication.  In 2014 he weighed 258 pounds. Now weighs 259 pounds. Blood pressure today is 138/82. History of hyperlipidemia but stopped taking Crestor because he said it made his legs hurt. Fasting labs are pending today.  History of left lower lobe pneumonia 2008  He is allergic to bee stings  Social history: He is divorced. 2 daughters. One grandchild and another one on the way. Youngest daughter still lives with him and is in high school. Oldest daughter is married. Ex-wife has remarried. He does not smoke. Social alcohol consumption.  Family history: Mother with history of diabetes, hypertension, hyperlipidemia, obesity. Father with history of coronary artery disease, history of melanoma, hypertension, morbid obesity, sleep apnea. One sister with mild hypertension and migraine headaches.    Review of Systems  Respiratory: Negative.   Cardiovascular: Negative for chest pain, palpitations and leg swelling.  Gastrointestinal: Negative.   Genitourinary: Negative.   Musculoskeletal: Positive for arthralgias.       Has bony  prominence right ankle that bothers him  Neurological: Negative.   Hematological: Negative.   Psychiatric/Behavioral: Negative.        Objective:   Physical Exam  Constitutional: He is oriented to person, place, and time. He appears well-developed and well-nourished. No distress.  HENT:  Head: Normocephalic and atraumatic.  Right Ear: External ear normal.  Left Ear: External ear normal.  Mouth/Throat: Oropharynx is clear and moist.  Eyes: Conjunctivae and EOM are normal. Pupils are equal, round, and reactive to light. Right eye exhibits no discharge. Left eye exhibits no discharge. No scleral icterus.  Mild arteriolar narrowing on funduscopic exam  Neck: Neck supple. No JVD present. No thyromegaly present.  Cardiovascular: Normal rate, regular rhythm and normal heart sounds.  Exam reveals no gallop.   No murmur heard. Pulmonary/Chest: Effort normal and breath sounds normal. No respiratory distress. He has no wheezes. He has no rales.  Abdominal: Soft. Bowel sounds are normal. He exhibits no distension and no mass. There is no tenderness. There is no rebound and no guarding.  Genitourinary:  Deferred  Musculoskeletal: He exhibits no edema.  Bony prominence right medial ankle which is painful with wearing boots  Lymphadenopathy:    He has no cervical adenopathy.  Neurological: He is alert and oriented to person, place, and time. He has normal reflexes. No cranial nerve deficit. Coordination normal.  Skin: Skin is warm and dry. No rash noted. He is not diaphoretic.  Psychiatric: He has a normal mood and affect. His behavior is normal. Judgment and thought content normal.  Vitals reviewed.  Assessment & Plan:  EKG shows no acute changes and sinus rhythm  Impression:  Elevated blood pressure which is labile. He likely has essential hypertension. 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 May need to see orthopedist-  Plan: Lab work drawn and pending including free T4 and TSH with elevated blood pressure. Start anti-hypertensive medication and follow-upSeptember 27  Addendum: He has significant hyperlipidemia. He stopped taking Crestor due to musculoskeletal pain. Try Lipitor 20 mg daily and follow-up in 3 months. He must be on some lipid-lowering medication based on significant elevation of total cholesterol, LDL cholesterol and triglycerides. Also add hemoglobin A1c to screen for diabetes because of family history and elevated triglycerides. He really needs to watch his diet. He has a hectic lifestyle trading cattle.

## 2015-01-03 NOTE — Telephone Encounter (Signed)
Spoke with patient reviewed lab results sent in script for lipitor and coenzyme Q10 per Dr Lenord Fellers

## 2015-01-03 NOTE — Patient Instructions (Signed)
Start Cozaar 50 mg daily. Return September 27. Screen for diabetes with hemoglobin A1c. Start Lipitor 20 mg daily for hyperlipidemia

## 2015-02-02 ENCOUNTER — Other Ambulatory Visit: Payer: Self-pay | Admitting: Internal Medicine

## 2015-02-08 ENCOUNTER — Ambulatory Visit: Payer: BLUE CROSS/BLUE SHIELD | Admitting: Internal Medicine

## 2015-02-09 ENCOUNTER — Ambulatory Visit (INDEPENDENT_AMBULATORY_CARE_PROVIDER_SITE_OTHER): Payer: BLUE CROSS/BLUE SHIELD | Admitting: Internal Medicine

## 2015-02-09 ENCOUNTER — Encounter: Payer: Self-pay | Admitting: Internal Medicine

## 2015-02-09 VITALS — BP 122/82 | HR 66 | Temp 97.6°F | Ht 76.0 in | Wt 265.0 lb

## 2015-02-09 DIAGNOSIS — Z23 Encounter for immunization: Secondary | ICD-10-CM | POA: Diagnosis not present

## 2015-02-09 DIAGNOSIS — I10 Essential (primary) hypertension: Secondary | ICD-10-CM | POA: Diagnosis not present

## 2015-02-09 DIAGNOSIS — J069 Acute upper respiratory infection, unspecified: Secondary | ICD-10-CM | POA: Diagnosis not present

## 2015-02-09 DIAGNOSIS — Z79899 Other long term (current) drug therapy: Secondary | ICD-10-CM

## 2015-02-09 LAB — BASIC METABOLIC PANEL
BUN: 13 mg/dL (ref 7–25)
CHLORIDE: 102 mmol/L (ref 98–110)
CO2: 26 mmol/L (ref 20–31)
Calcium: 9.3 mg/dL (ref 8.6–10.3)
Creat: 0.82 mg/dL (ref 0.60–1.35)
Glucose, Bld: 88 mg/dL (ref 65–99)
POTASSIUM: 4 mmol/L (ref 3.5–5.3)
SODIUM: 137 mmol/L (ref 135–146)

## 2015-02-09 MED ORDER — CLARITHROMYCIN 500 MG PO TABS: 500.0000 mg | ORAL_TABLET | Freq: Two times a day (BID) | ORAL | Status: DC

## 2015-02-09 NOTE — Patient Instructions (Signed)
Continue Cozaar and lipid-lowering medication. Take Biaxin for respiratory infection. Do not take Lipitor while taking Biaxin. Return in December for office visit, blood pressure check, lipid panel liver functions. Flu vaccine given today.

## 2015-02-09 NOTE — Progress Notes (Signed)
   Subjective:    Patient ID: John Garcia, male    DOB: 02/21/1966, 49 y.o.   MRN: [id number]  HPI  In today to follow-up on essential hypertension. Blood pressure now normal on Cozaar. He feels well on the medication. He's been checking it at home and it's been except will. He's back on Lipitor for hyperlipidemia. Need to follow-up in December with that. Flu vaccine given today. He has no acute URI with cough and discolored sputum. No fever or shaking chills. Talked with him about diet and exercise. He has not changed his diet spot taking Lipitor.    Review of Systems     Objective:   Physical Exam  Skin warm and dry. Nodes none. TMs are clear. Pharynx is clear. Neck is supple. Chest clear to auscultation without rales or wheezing. He sounds nasally congested. Cardiac exam regular rate and rhythm. Normal S1 and S2. Extremities without edema.      Assessment & Plan:  Essential hypertension-now stable and under good control on Cozaar. Basic metabolic panel drawn.  Hyperlipidemia-just restarted Lipitor. Follow-up in December  Obesity-work on diet and exercise  Acute URI-treat with Biaxin 500 mg twice daily for 10 days which has worked well for him in the past. Do not take Lipitor while taking Biaxin.  Plan: Return December for office visit lipid panel liver functions and blood pressure check. Flu vaccine given today.

## 2015-02-10 ENCOUNTER — Telehealth: Payer: Self-pay | Admitting: *Deleted

## 2015-02-10 NOTE — Telephone Encounter (Signed)
Left message with recent lab results on patient voice mail 

## 2015-03-18 ENCOUNTER — Other Ambulatory Visit: Payer: Self-pay | Admitting: Internal Medicine

## 2015-04-05 ENCOUNTER — Ambulatory Visit (INDEPENDENT_AMBULATORY_CARE_PROVIDER_SITE_OTHER): Payer: BLUE CROSS/BLUE SHIELD | Admitting: Internal Medicine

## 2015-04-05 ENCOUNTER — Encounter: Payer: Self-pay | Admitting: Internal Medicine

## 2015-04-05 VITALS — BP 126/76 | HR 80 | Temp 97.7°F | Resp 20 | Ht 76.0 in | Wt 268.0 lb

## 2015-04-05 DIAGNOSIS — J209 Acute bronchitis, unspecified: Secondary | ICD-10-CM

## 2015-04-05 DIAGNOSIS — H6692 Otitis media, unspecified, left ear: Secondary | ICD-10-CM | POA: Diagnosis not present

## 2015-04-05 MED ORDER — LEVOFLOXACIN 500 MG PO TABS
500.0000 mg | ORAL_TABLET | Freq: Every day | ORAL | Status: DC
Start: 2015-04-05 — End: 2015-08-24

## 2015-04-05 MED ORDER — METHYLPREDNISOLONE ACETATE 80 MG/ML IJ SUSP
80.0000 mg | Freq: Once | INTRAMUSCULAR | Status: AC
  Administered 2015-04-05: 80 mg via INTRAMUSCULAR

## 2015-04-05 MED ORDER — HYDROCODONE-HOMATROPINE 5-1.5 MG/5ML PO SYRP: 5.0000 mL | ORAL_SOLUTION | Freq: Three times a day (TID) | ORAL | Status: DC | PRN

## 2015-04-05 MED ORDER — CEFTRIAXONE SODIUM 1 G IJ SOLR
1.0000 g | Freq: Once | INTRAMUSCULAR | Status: AC
  Administered 2015-04-05: 1 g via INTRAMUSCULAR

## 2015-04-05 NOTE — Patient Instructions (Signed)
Take Levaquin 500 milligrams daily for 10 days. Take Hycodan as needed for cough. Given Depo-Medrol 80 mg IM and Rocephin 1 g IM in office today. Call if not better in 10 days or sooner if worse

## 2015-04-13 NOTE — Progress Notes (Signed)
   Subjective:    Patient ID: John Garcia, male    DOB: Apr 05, 1966, 49 y.o.   MRN: [id number]  HPI Says he never got completely well from previous URI. At that time was treated with Biaxin. Recently tried taking girlfriend's Septra but couldn't see much difference. Says he cannot hear and that his ears are ringing. Says they're stopped up. Has cough and congestion. No fever or shaking chills.    Review of Systems     Objective:   Physical Exam Left TM is red and dull. Right TM not injected. Pharynx is slightly injected. Neck is supple. Chest is clear to auscultation without rales or wheezing. Cardiac exam regular rate and rhythm. Skin warm and dry.       Assessment & Plan:  Acute URI-feel this is probably a new respiratory infection rather than prolonged respiratory infection his last visit was September 28.  Left otitis media  Plan: Depo-Medrol 80 mg IM. Rocephin 1 g IM. Levaquin 500 milligrams daily for 10 days. Hycodan 1 teaspoon by mouth every 8 hours when necessary cough.

## 2015-05-10 ENCOUNTER — Ambulatory Visit: Payer: BLUE CROSS/BLUE SHIELD | Admitting: Internal Medicine

## 2015-05-11 ENCOUNTER — Encounter: Payer: Self-pay | Admitting: Internal Medicine

## 2015-05-11 ENCOUNTER — Telehealth: Payer: Self-pay | Admitting: Internal Medicine

## 2015-05-11 NOTE — Telephone Encounter (Signed)
Patient no showed for his 3 month follow up on 05/10/15 @ 9:45 a.m.  Sent letter to patient to call the office and re-schedule appointment and also reminding patient of our no show policy.  Did not charge patient the $50 fee this time for no show.

## 2015-07-05 ENCOUNTER — Other Ambulatory Visit: Payer: Self-pay | Admitting: Internal Medicine

## 2015-08-24 ENCOUNTER — Ambulatory Visit (INDEPENDENT_AMBULATORY_CARE_PROVIDER_SITE_OTHER): Payer: BLUE CROSS/BLUE SHIELD | Admitting: Internal Medicine

## 2015-08-24 ENCOUNTER — Encounter: Payer: Self-pay | Admitting: Internal Medicine

## 2015-08-24 VITALS — BP 142/78 | HR 65 | Temp 97.0°F | Resp 20 | Ht 76.0 in | Wt 271.5 lb

## 2015-08-24 DIAGNOSIS — M791 Myalgia: Secondary | ICD-10-CM

## 2015-08-24 DIAGNOSIS — Z79899 Other long term (current) drug therapy: Secondary | ICD-10-CM | POA: Diagnosis not present

## 2015-08-24 DIAGNOSIS — E785 Hyperlipidemia, unspecified: Secondary | ICD-10-CM | POA: Diagnosis not present

## 2015-08-24 DIAGNOSIS — J069 Acute upper respiratory infection, unspecified: Secondary | ICD-10-CM | POA: Diagnosis not present

## 2015-08-24 DIAGNOSIS — M7918 Myalgia, other site: Secondary | ICD-10-CM

## 2015-08-24 DIAGNOSIS — E669 Obesity, unspecified: Secondary | ICD-10-CM

## 2015-08-24 DIAGNOSIS — I10 Essential (primary) hypertension: Secondary | ICD-10-CM | POA: Diagnosis not present

## 2015-08-24 LAB — HEPATIC FUNCTION PANEL
ALK PHOS: 87 U/L (ref 40–115)
ALT: 24 U/L (ref 9–46)
AST: 15 U/L (ref 10–40)
Albumin: 4.2 g/dL (ref 3.6–5.1)
BILIRUBIN DIRECT: 0.1 mg/dL (ref <=0.2)
BILIRUBIN INDIRECT: 0.5 mg/dL (ref 0.2–1.2)
BILIRUBIN TOTAL: 0.6 mg/dL (ref 0.2–1.2)
TOTAL PROTEIN: 6.7 g/dL (ref 6.1–8.1)

## 2015-08-24 LAB — LIPID PANEL
CHOL/HDL RATIO: 3.6 ratio (ref <=5.0)
Cholesterol: 153 mg/dL (ref 125–200)
HDL: 43 mg/dL (ref >=40)
LDL CALC: 84 mg/dL (ref <130)
Triglycerides: 129 mg/dL (ref <150)
VLDL: 26 mg/dL (ref <30)

## 2015-08-24 MED ORDER — AMOXICILLIN-POT CLAVULANATE 500-125 MG PO TABS: 1.0000 | ORAL_TABLET | Freq: Three times a day (TID) | ORAL | Status: DC

## 2015-08-24 MED ORDER — HYDROCODONE-ACETAMINOPHEN 5-325 MG PO TABS: 1.0000 | ORAL_TABLET | Freq: Four times a day (QID) | ORAL | Status: DC | PRN

## 2015-08-24 MED ORDER — MELOXICAM 15 MG PO TABS: 15.0000 mg | ORAL_TABLET | Freq: Every day | ORAL | Status: DC

## 2015-08-30 ENCOUNTER — Other Ambulatory Visit: Payer: Self-pay | Admitting: Internal Medicine

## 2015-09-11 NOTE — Patient Instructions (Signed)
Take Augmentin as prescribed. Meloxicam to be taken for musculoskeletal pain. Call if not better next week. [facility] Please call for appointment. Follow-up in 6 months here for physical exam.

## 2015-09-11 NOTE — Progress Notes (Signed)
   Subjective:    Patient ID: John Garcia, male    DOB: 09-19-1965, 50 y.o.   MRN: [id number]  HPI He called complaining of leg pain. His girlfriend who is a Engineer, civil (consulting)nurse says he has palpable knot in left groin. Patient says knot is tender. Has chronic musculoskeletal pain in hips, knees and back. He was originally due for follow-up on hypertension and hyperlipidemia. Fasting lab work drawn today. Remote history of atrial fibrillation with rapid ventricular response treated with IV Cardizem 2006. Had cardiac catheterization at that time. He is not on medication for atrial fib.  In 2011, he was going through a divorce and lost down to 229 pounds. Now weighs 271.5 pounds.  Also, has nevus on back that his girlfriend is concerned about. Father has history of melanoma.    Review of Systems no fever or shaking chills. Some cough and congestion     Objective:   Physical Exam Appears that he has a small palpable lymph node that is mobile in left groin. No hernia appreciated. Straight leg raising is negative at 90 on the left. Muscle strength is normal in the left lower extremity.Has dark nevus mid back     Assessment & Plan:  Left groin lymph node-benign call if not resolved in 3-4 weeks.  Musculoskeletal pain  Essential hypertension  Hyperlipidemia  Obesity  History of atrial fibrillation  Nevus on back-patient told to [facility]  Acute URI  Plan: Fasting lipid panel and liver functions are within normal limits. Augmentin 500 mg 3 times daily for 10 days. For musculoskeletal pain and left leg and groin meloxicam 15 mg daily

## 2015-09-19 ENCOUNTER — Other Ambulatory Visit: Payer: Self-pay | Admitting: Internal Medicine

## 2016-01-03 ENCOUNTER — Other Ambulatory Visit: Payer: Self-pay | Admitting: Internal Medicine

## 2016-01-03 NOTE — Telephone Encounter (Signed)
Needs CPE October please call before refilling until then

## 2016-01-05 ENCOUNTER — Telehealth: Payer: Self-pay

## 2016-01-05 NOTE — Telephone Encounter (Signed)
Spoke to patient. Advised Rx sent Scheduled CPE labs and CPE OV.

## 2016-01-27 ENCOUNTER — Encounter: Payer: Self-pay | Admitting: Internal Medicine

## 2016-01-27 ENCOUNTER — Ambulatory Visit
Admission: RE | Admit: 2016-01-27 | Discharge: 2016-01-27 | Disposition: A | Discharge status: Home / Self Care | Payer: BLUE CROSS/BLUE SHIELD | Source: Ambulatory Visit | Attending: Internal Medicine | Admitting: Internal Medicine

## 2016-01-27 ENCOUNTER — Ambulatory Visit (INDEPENDENT_AMBULATORY_CARE_PROVIDER_SITE_OTHER): Payer: BLUE CROSS/BLUE SHIELD | Admitting: Internal Medicine

## 2016-01-27 VITALS — BP 130/72 | HR 73 | Ht 76.0 in | Wt 280.0 lb

## 2016-01-27 DIAGNOSIS — M1612 Unilateral primary osteoarthritis, left hip: Secondary | ICD-10-CM | POA: Diagnosis not present

## 2016-01-27 DIAGNOSIS — M25552 Pain in left hip: Secondary | ICD-10-CM

## 2016-01-27 DIAGNOSIS — Z0000 Encounter for general adult medical examination without abnormal findings: Secondary | ICD-10-CM | POA: Diagnosis not present

## 2016-01-27 DIAGNOSIS — E785 Hyperlipidemia, unspecified: Secondary | ICD-10-CM

## 2016-01-27 DIAGNOSIS — E559 Vitamin D deficiency, unspecified: Secondary | ICD-10-CM | POA: Diagnosis not present

## 2016-01-27 DIAGNOSIS — I10 Essential (primary) hypertension: Secondary | ICD-10-CM | POA: Diagnosis not present

## 2016-01-27 DIAGNOSIS — Z8679 Personal history of other diseases of the circulatory system: Secondary | ICD-10-CM

## 2016-01-27 LAB — COMPREHENSIVE METABOLIC PANEL
ALBUMIN: 4.1 g/dL (ref 3.6–5.1)
ALT: 22 U/L (ref 9–46)
AST: 17 U/L (ref 10–40)
Alkaline Phosphatase: 79 U/L (ref 40–115)
BILIRUBIN TOTAL: 0.4 mg/dL (ref 0.2–1.2)
BUN: 18 mg/dL (ref 7–25)
CO2: 25 mmol/L (ref 20–31)
CREATININE: 0.8 mg/dL (ref 0.60–1.35)
Calcium: 8.8 mg/dL (ref 8.6–10.3)
Chloride: 105 mmol/L (ref 98–110)
Glucose, Bld: 85 mg/dL (ref 65–99)
Potassium: 4.5 mmol/L (ref 3.5–5.3)
SODIUM: 138 mmol/L (ref 135–146)
Total Protein: 6.4 g/dL (ref 6.1–8.1)

## 2016-01-27 LAB — CBC WITH DIFFERENTIAL/PLATELET
Basophils Absolute: 0 cells/uL (ref 0–200)
Basophils Relative: 0 %
EOS PCT: 2 %
Eosinophils Absolute: 136 cells/uL (ref 15–500)
HCT: 46.2 % (ref 38.5–50.0)
Hemoglobin: 15.5 g/dL (ref 13.2–17.1)
LYMPHS ABS: 2448 {cells}/uL (ref 850–3900)
LYMPHS PCT: 36 %
MCH: 31.4 pg (ref 27.0–33.0)
MCHC: 33.5 g/dL (ref 32.0–36.0)
MCV: 93.7 fL (ref 80.0–100.0)
MONOS PCT: 9 %
MPV: 10.5 fL (ref 7.5–12.5)
Monocytes Absolute: 612 cells/uL (ref 200–950)
NEUTROS PCT: 53 %
Neutro Abs: 3604 cells/uL (ref 1500–7800)
Platelets: 181 10*3/uL (ref 140–400)
RBC: 4.93 MIL/uL (ref 4.20–5.80)
RDW: 13 % (ref 11.0–15.0)
WBC: 6.8 10*3/uL (ref 3.8–10.8)

## 2016-01-27 LAB — LIPID PANEL
Cholesterol: 157 mg/dL (ref 125–200)
HDL: 50 mg/dL (ref >=40)
LDL CALC: 86 mg/dL (ref <130)
TRIGLYCERIDES: 107 mg/dL (ref <150)
Total CHOL/HDL Ratio: 3.1 Ratio (ref <=5.0)
VLDL: 21 mg/dL (ref <30)

## 2016-01-27 LAB — POC URINALSYSI DIPSTICK (AUTOMATED)
BILIRUBIN UA: NEGATIVE
Glucose, UA: NEGATIVE
Leukocytes, UA: NEGATIVE
Nitrite, UA: NEGATIVE
PH UA: 5
Protein, UA: NEGATIVE
RBC UA: NEGATIVE
SPEC GRAV UA: 1.02
Urobilinogen, UA: NEGATIVE

## 2016-01-27 LAB — TSH: TSH: 1.53 m[IU]/L (ref 0.40–4.50)

## 2016-01-28 LAB — VITAMIN D 25 HYDROXY (VIT D DEFICIENCY, FRACTURES): VIT D 25 HYDROXY: 23 ng/mL — AB (ref 30–100)

## 2016-02-02 ENCOUNTER — Other Ambulatory Visit: Payer: Self-pay | Admitting: Internal Medicine

## 2016-02-02 NOTE — Telephone Encounter (Signed)
Refill each x 6 months 

## 2016-02-03 ENCOUNTER — Other Ambulatory Visit: Payer: Self-pay | Admitting: *Deleted

## 2016-02-03 MED ORDER — ATORVASTATIN CALCIUM 20 MG PO TABS: 20.0000 mg | ORAL_TABLET | Freq: Every day | ORAL | 3 refills | Status: DC

## 2016-02-03 MED ORDER — LOSARTAN POTASSIUM 50 MG PO TABS: 50.0000 mg | ORAL_TABLET | Freq: Every day | ORAL | 3 refills | Status: DC

## 2016-02-03 MED ORDER — MELOXICAM 15 MG PO TABS: ORAL_TABLET | ORAL | 3 refills | Status: DC

## 2016-02-06 ENCOUNTER — Other Ambulatory Visit: Payer: Self-pay | Admitting: General Surgery

## 2016-02-06 DIAGNOSIS — R1032 Left lower quadrant pain: Secondary | ICD-10-CM | POA: Diagnosis not present

## 2016-02-08 NOTE — Progress Notes (Signed)
   Subjective:    Patient ID: John Garcia, male    DOB: [birth year], 50 y.o.   MRN: [id number]  HPI Patient called recently complaining of persistent left groin and hip pain. He's not sure what's wrong. Prickly bothersome at night. He went out [place] with his daughter for about a month traveling attending high school events.  Fasting labs drawn today. He has a history of hyperlipidemia treated with statin medication. Lipid panel and liver functions are normal. His vitamin D level was 23. He could benefit from 1000 units vitamin D 3 daily. Blood pressure stable.  He and his girlfriend has noted some sort of nodule in his left groin. It could be a benign lymph node. However left hip and upper thigh have been aching a lot.  Not clear to me whether this is a hernia, a hip problem and /or benign lymph node in the groin. I do think he has a small lymph node in the left groin that is mobile and has not increased in size.    Review of Systems as above. He has no weight loss, no decreased appetite, no night sweats     Objective:   Physical Exam He has some pain with internal and external rotation of left hip. I do not appreciate a distinct hernia to direct palpation. Small 1.5 cm nodule left groin consistent with benign lymph node       Assessment & Plan:  Possible left hip arthropathy-to have hip x-ray.  Left groin pain-? Musculoskeletal strain, hernia, or possibly just left inguinal adenopathy  Essential hypertension  Plan: Refer to surgeon for evaluation. Trial of meloxicam 15 mg daily. Urinalysis is normal.  Otherwise return in 6 months for physical exam. Continue losartan,

## 2016-02-08 NOTE — Patient Instructions (Addendum)
To see surgeon regarding left groin and hip pain. Have surgeon check for hernia. Have x-ray of left hip. Continue losartan and statin medication. Take 1000 units vitamin D D3 for vitamin D deficiency. Return in 6 months for physical examination.

## 2016-02-13 ENCOUNTER — Ambulatory Visit
Admission: RE | Admit: 2016-02-13 | Discharge: 2016-02-13 | Disposition: A | Discharge status: Home / Self Care | Payer: BLUE CROSS/BLUE SHIELD | Source: Ambulatory Visit | Attending: General Surgery | Admitting: General Surgery

## 2016-02-13 DIAGNOSIS — K4090 Unilateral inguinal hernia, without obstruction or gangrene, not specified as recurrent: Secondary | ICD-10-CM | POA: Diagnosis not present

## 2016-02-13 DIAGNOSIS — R1032 Left lower quadrant pain: Secondary | ICD-10-CM

## 2016-02-29 ENCOUNTER — Ambulatory Visit (INDEPENDENT_AMBULATORY_CARE_PROVIDER_SITE_OTHER): Payer: BLUE CROSS/BLUE SHIELD | Admitting: Orthopaedic Surgery

## 2016-03-05 ENCOUNTER — Other Ambulatory Visit: Payer: Self-pay | Admitting: Internal Medicine

## 2016-03-06 ENCOUNTER — Encounter: Payer: Self-pay | Admitting: Internal Medicine

## 2016-03-06 DIAGNOSIS — K4090 Unilateral inguinal hernia, without obstruction or gangrene, not specified as recurrent: Secondary | ICD-10-CM | POA: Diagnosis not present

## 2016-03-27 ENCOUNTER — Ambulatory Visit (INDEPENDENT_AMBULATORY_CARE_PROVIDER_SITE_OTHER): Payer: BLUE CROSS/BLUE SHIELD | Admitting: Orthopaedic Surgery

## 2016-03-27 DIAGNOSIS — E782 Mixed hyperlipidemia: Secondary | ICD-10-CM | POA: Diagnosis not present

## 2016-03-27 DIAGNOSIS — Z79899 Other long term (current) drug therapy: Secondary | ICD-10-CM | POA: Diagnosis not present

## 2016-03-27 DIAGNOSIS — M1612 Unilateral primary osteoarthritis, left hip: Secondary | ICD-10-CM | POA: Diagnosis not present

## 2016-03-27 DIAGNOSIS — E119 Type 2 diabetes mellitus without complications: Secondary | ICD-10-CM | POA: Diagnosis not present

## 2016-03-27 DIAGNOSIS — E559 Vitamin D deficiency, unspecified: Secondary | ICD-10-CM | POA: Diagnosis not present

## 2016-03-27 DIAGNOSIS — I10 Essential (primary) hypertension: Secondary | ICD-10-CM | POA: Diagnosis not present

## 2016-03-27 DIAGNOSIS — M25552 Pain in left hip: Secondary | ICD-10-CM

## 2016-03-27 DIAGNOSIS — D518 Other vitamin B12 deficiency anemias: Secondary | ICD-10-CM | POA: Diagnosis not present

## 2016-03-27 DIAGNOSIS — E038 Other specified hypothyroidism: Secondary | ICD-10-CM | POA: Diagnosis not present

## 2016-03-27 NOTE — Progress Notes (Signed)
Office Visit Note   Patient: John Garcia           Date of Birth: 1965-08-16           MRN: [id number] Visit Date: 03/27/2016              Requested by: Margaree MackintoshMary J Baxley, MD [address] [place] [id number] PCP: Margaree MackintoshBAXLEY,MARY J, MD   Assessment & Plan: Visit Diagnoses:  1. Pain of left hip joint   2. Unilateral primary osteoarthritis, left hip     Plan: I showed him a hip model and explained in detail the disease process showing him his x-rays as well. I explained what hip replacement surgery involves as well as a thorough discussion of the risks and benefits of surgery and is intraoperative and postoperative courses. This is something is definitely incision. His pain is daily and it's detrimentally affected activities daily living his quality of life and his mobility. This definitely affecting his job. He like to have this deathly set up. We would then see him back in 2 weeks postoperative but no x-rays of be needed.  Follow-Up Instructions: No Follow-up on file.   Orders:  No orders of the defined types were placed in this encounter.  No orders of the defined types were placed in this encounter.     Procedures: No procedures performed   Clinical Data: No additional findings.   Subjective: Chief Complaint  Patient presents with  . Left Hip - Pain    Pain x4-5 months; NKI, some groin pain. xrays on canopy.   His pain is in the groin and sometimes is 3 out of 10 and felt to be 8 out of 10 at times. He denies any leg length discrepancy. His Judeth CornfieldFaller has had shoulder replacement and hip replacements as well as a knee replacement. He denies a specific injury but his pain is definitely in the groin. He has x-rays on the cone system from interview. 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 It hurts crossing his legs. His significant other says that he is deathly walking with a limp and it seems to bother him quite a bit. HPI  Review of Systems He  denies any headache, chest pain, shortness breath, fever, chills, nausea, vomiting.  Objective: Vital Signs: There were no vitals taken for this visit.  Physical Exam  Constitutional: He is oriented to person, place, and time. He appears well-developed and well-nourished.  HENT:  Head: Normocephalic and atraumatic.  Eyes: EOM are normal. Pupils are equal, round, and reactive to light.  Neck: Normal range of motion. Neck supple.  Cardiovascular: Normal rate and regular rhythm.   Pulmonary/Chest: Effort normal and breath sounds normal.  Abdominal: Soft. Bowel sounds are normal.  Musculoskeletal:       Left hip: He exhibits decreased range of motion, decreased strength, tenderness and bony tenderness.  Neurological: He is alert and oriented to person, place, and time.  Skin: Skin is warm and dry.   He is alert and oriented 3 in no acute distress Route discomfort. Ortho Exam  Specialty Comments:  No specialty comments available.  Imaging: No results found.   PMFS History: Patient Active Problem List   Diagnosis Date Noted  . Essential hypertension 02/09/2015  . Hyperlipidemia 01/10/2011   Past Medical History:  Diagnosis Date  . Atrial flutter with rapid ventricular response (HCC)   . Hyperlipidemia   . Obesity     Family History  Problem Relation Age of Onset  . Hypertension Mother     No past surgical history on file. Social History   Occupational History  . Not on file.   Social History Main Topics  . Smoking status: Never Smoker  . Smokeless tobacco: Never Used  . Alcohol use Yes     Comment: socially  . Drug use: Unknown  . Sexual activity: Not on file

## 2016-05-01 ENCOUNTER — Inpatient Hospital Stay: Admit: 2016-05-01 | Payer: BLUE CROSS/BLUE SHIELD | Admitting: Orthopaedic Surgery

## 2016-05-01 SURGERY — ARTHROPLASTY, HIP, TOTAL, ANTERIOR APPROACH
Anesthesia: Choice | Laterality: Left

## 2016-05-08 ENCOUNTER — Encounter: Payer: BLUE CROSS/BLUE SHIELD | Admitting: Internal Medicine

## 2016-05-15 ENCOUNTER — Inpatient Hospital Stay (INDEPENDENT_AMBULATORY_CARE_PROVIDER_SITE_OTHER): Payer: BLUE CROSS/BLUE SHIELD | Admitting: Orthopaedic Surgery

## 2016-05-23 DIAGNOSIS — E782 Mixed hyperlipidemia: Secondary | ICD-10-CM | POA: Diagnosis not present

## 2016-05-23 DIAGNOSIS — E6609 Other obesity due to excess calories: Secondary | ICD-10-CM | POA: Diagnosis not present

## 2016-05-23 DIAGNOSIS — Z6835 Body mass index (BMI) 35.0-35.9, adult: Secondary | ICD-10-CM | POA: Diagnosis not present

## 2016-05-23 DIAGNOSIS — I10 Essential (primary) hypertension: Secondary | ICD-10-CM | POA: Diagnosis not present

## 2016-07-25 ENCOUNTER — Ambulatory Visit (INDEPENDENT_AMBULATORY_CARE_PROVIDER_SITE_OTHER): Payer: BLUE CROSS/BLUE SHIELD | Admitting: Orthopaedic Surgery

## 2016-07-25 ENCOUNTER — Encounter (INDEPENDENT_AMBULATORY_CARE_PROVIDER_SITE_OTHER): Payer: Self-pay | Admitting: Orthopaedic Surgery

## 2016-07-25 ENCOUNTER — Ambulatory Visit (INDEPENDENT_AMBULATORY_CARE_PROVIDER_SITE_OTHER): Payer: Self-pay

## 2016-07-25 DIAGNOSIS — M1612 Unilateral primary osteoarthritis, left hip: Secondary | ICD-10-CM | POA: Diagnosis not present

## 2016-07-25 MED ORDER — BUPIVACAINE HCL 0.5 % IJ SOLN
3.0000 mL | INTRAMUSCULAR | Status: AC | PRN
  Administered 2016-07-25: 3 mL via INTRA_ARTICULAR

## 2016-07-25 MED ORDER — TRIAMCINOLONE ACETONIDE 40 MG/ML IJ SUSP
80.0000 mg | INTRAMUSCULAR | Status: AC | PRN
  Administered 2016-07-25: 80 mg via INTRA_ARTICULAR

## 2016-07-25 NOTE — Addendum Note (Signed)
Addended by: Ashok NorrisNEWTON, Rubye Strohmeyer K on: 07/25/2016 10:36 AM   Modules accepted: Orders

## 2016-07-25 NOTE — Progress Notes (Signed)
Patient came in today hoping for a intra-articular injection of his left hip. We'll see how we can accommodate him with Dr. Alvester MorinNewton at the most early evidence time available. We'll see him back in a month to check his progress lack of. No charge for today's office visit

## 2016-07-25 NOTE — Progress Notes (Signed)
   John Garcia - 51 y.o. male MRN [id number]  Date of birth: [birth year]  Office Visit Note: Visit Date: 07/25/2016 PCP: Margaree MackintoshMary J Baxley, MD Referred by: Margaree MackintoshBaxley, Mary J, MD  Subjective: Chief Complaint  Patient presents with  . Left Hip - Pain    Would like left hip injection, previously scheduled for THA last year. Complains of lateral hip and groin pain. Also has hernia in this area.   HPI: John Garcia is a 51 year old gentleman with osteoporosis of the left hip. He was seen today by Rexene EdisonGil Clark who recommended hip injection intra-articularly with anesthetic arthrogram. This was performed today.    ROS Otherwise per HPI.  Assessment & Plan: Visit Diagnoses:  1. Primary osteoarthritis of left hip     Plan: Findings:  Left hip anesthetic arthrogram. Patient did have some relief during the anesthetic phase of the injection.    Meds & Orders: No orders of the defined types were placed in this encounter.   Orders Placed This Encounter  Procedures  . Large Joint Injection/Arthrocentesis  . XR C-ARM NO REPORT    Follow-up: Return in about 4 weeks (around 08/22/2016).   Procedures: Hip anesthetic arthrogram Date/Time: 07/25/2016 10:25 AM Performed by: Tyrell AntonioNEWTON, Chanz Cahall Authorized by: Tyrell AntonioNEWTON, Captain Blucher   Consent Given by:  Patient Site marked: the procedure site was marked   Timeout: prior to procedure the correct patient, procedure, and site was verified   Indications:  Pain and diagnostic evaluation Location:  Hip Prep: patient was prepped and draped in usual sterile fashion   Needle Size:  22 G Approach:  Anterior Ultrasound Guidance: No   Fluoroscopic Guidance: No   Arthrogram: Yes   Medications:  80 mg triamcinolone acetonide 40 MG/ML; 3 mL bupivacaine 0.5 % Aspiration Attempted: Yes   Patient tolerance:  Patient tolerated the procedure well with no immediate complications  Arthrogram demonstrated excellent flow of contrast throughout the joint surface without  extravasation or obvious defect.  The patient had relief of symptoms during the anesthetic phase of the injection.     No notes on file   Clinical History: No specialty comments available.  He reports that he has never smoked. He has never used smokeless tobacco. No results for input(s): HGBA1C, LABURIC in the last 8760 hours.  Objective:  VS:  HT:    WT:   BMI:     BP:   HR: bpm  TEMP: ( )  RESP:  Physical Exam  Musculoskeletal:  Painful range of motion of the left hip. Good distal strength.    Ortho Exam Imaging: Xr C-arm No Report  Result Date: 07/25/2016 Please see Notes or Procedures tab for imaging impression.   Past Medical/Family/Surgical/Social History: Medications & Allergies reviewed per EMR Patient Active Problem List   Diagnosis Date Noted  . Essential hypertension 02/09/2015  . Hyperlipidemia 01/10/2011   Past Medical History:  Diagnosis Date  . Atrial flutter with rapid ventricular response (HCC)   . Hyperlipidemia   . Obesity    Family History  Problem Relation Age of Onset  . Hypertension Mother    No past surgical history on file. Social History   Occupational History  . Not on file.   Social History Main Topics  . Smoking status: Never Smoker  . Smokeless tobacco: Never Used  . Alcohol use Yes     Comment: socially  . Drug use: Unknown  . Sexual activity: Not on file

## 2016-07-27 ENCOUNTER — Telehealth (INDEPENDENT_AMBULATORY_CARE_PROVIDER_SITE_OTHER): Payer: Self-pay | Admitting: Specialist

## 2016-07-27 NOTE — Telephone Encounter (Signed)
Mr. Wintermute called this evening with increasing pain and discomfort in his left hip. Had a cortisone injection by Dr. Newton on 3/14 for severe pain requested by Gil Clark, PA-C I spoke to him and he is having significant pain. He live in [place] and there are no pharmacies open tonight.He  Does have anaprox and tylenol. Took four ibuprofen. No fever or chills. 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 I recommended that he use two 220 mg anaprox every 12 hours. He can use tylenol in between 2 arthritis strength tylenol 500 mg every 8 hours. Should wait at least 8 hours since he took the ibuprofen but can take tylenol right now and use an ice pack to decrease pain locally. Advised to use a cane in the right hand for left hip pain or crutches. If pain is severe then he would need to proceed to an ER or urgent care for a stronger medication to help with pain.  [facility] web site was reviewed and shows no sign of recent  Narcotic medication use. No sign of abuse. Should call back if still having problems and I would consider  Oral toradol and or tramadol.  

## 2016-07-27 NOTE — Telephone Encounter (Signed)
[name] called this evening with increasing pain and discomfort in his left hip. Had a cortisone injection by Dr. Alvester MorinNewton on 3/14 for severe pain requested by Rexene EdisonGil Clark, PA-C I spoke to him and he is having significant pain. He live in [place] and there are no pharmacies open tonight.He  Does have anaprox and tylenol. Took four ibuprofen. No fever or chills. 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 I recommended that he use two 220 mg anaprox every 12 hours. He can use tylenol in between 2 arthritis strength tylenol 500 mg every 8 hours. Should wait at least 8 hours since he took the ibuprofen but can take tylenol right now and use an ice pack to decrease pain locally. Advised to use a cane in the right hand for left hip pain or crutches. If pain is severe then he would need to proceed to an ER or urgent care for a stronger medication to help with pain.  [facility] web site was reviewed and shows no sign of recent  Narcotic medication use. No sign of abuse. Should call back if still having problems and I would consider  Oral toradol and or tramadol.

## 2016-08-07 ENCOUNTER — Encounter: Payer: Self-pay | Admitting: Internal Medicine

## 2016-08-07 ENCOUNTER — Ambulatory Visit (INDEPENDENT_AMBULATORY_CARE_PROVIDER_SITE_OTHER): Payer: BLUE CROSS/BLUE SHIELD | Admitting: Internal Medicine

## 2016-08-07 VITALS — BP 136/86 | HR 65 | Temp 97.9°F | Ht 76.0 in | Wt 272.0 lb

## 2016-08-07 DIAGNOSIS — Z6833 Body mass index (BMI) 33.0-33.9, adult: Secondary | ICD-10-CM | POA: Diagnosis not present

## 2016-08-07 DIAGNOSIS — Z13220 Encounter for screening for lipoid disorders: Secondary | ICD-10-CM

## 2016-08-07 DIAGNOSIS — Z8679 Personal history of other diseases of the circulatory system: Secondary | ICD-10-CM | POA: Diagnosis not present

## 2016-08-07 DIAGNOSIS — M21611 Bunion of right foot: Secondary | ICD-10-CM | POA: Diagnosis not present

## 2016-08-07 DIAGNOSIS — Z0000 Encounter for general adult medical examination without abnormal findings: Secondary | ICD-10-CM | POA: Diagnosis not present

## 2016-08-07 DIAGNOSIS — M21612 Bunion of left foot: Secondary | ICD-10-CM | POA: Diagnosis not present

## 2016-08-07 DIAGNOSIS — M1612 Unilateral primary osteoarthritis, left hip: Secondary | ICD-10-CM | POA: Diagnosis not present

## 2016-08-07 DIAGNOSIS — E784 Other hyperlipidemia: Secondary | ICD-10-CM | POA: Diagnosis not present

## 2016-08-07 DIAGNOSIS — I10 Essential (primary) hypertension: Secondary | ICD-10-CM

## 2016-08-07 DIAGNOSIS — E7849 Other hyperlipidemia: Secondary | ICD-10-CM

## 2016-08-07 LAB — COMPREHENSIVE METABOLIC PANEL
ALT: 21 U/L (ref 9–46)
AST: 14 U/L (ref 10–40)
Albumin: 3.9 g/dL (ref 3.6–5.1)
Alkaline Phosphatase: 84 U/L (ref 40–115)
BILIRUBIN TOTAL: 0.4 mg/dL (ref 0.2–1.2)
BUN: 20 mg/dL (ref 7–25)
CALCIUM: 8.7 mg/dL (ref 8.6–10.3)
CO2: 25 mmol/L (ref 20–31)
CREATININE: 0.75 mg/dL (ref 0.60–1.35)
Chloride: 105 mmol/L (ref 98–110)
Glucose, Bld: 95 mg/dL (ref 65–99)
Potassium: 4.4 mmol/L (ref 3.5–5.3)
SODIUM: 138 mmol/L (ref 135–146)
Total Protein: 6.3 g/dL (ref 6.1–8.1)

## 2016-08-07 LAB — CBC WITH DIFFERENTIAL/PLATELET
BASOS PCT: 0 %
Basophils Absolute: 0 cells/uL (ref 0–200)
EOS PCT: 2 %
Eosinophils Absolute: 138 cells/uL (ref 15–500)
HEMATOCRIT: 44.3 % (ref 38.5–50.0)
Hemoglobin: 14.8 g/dL (ref 13.2–17.1)
LYMPHS PCT: 30 %
Lymphs Abs: 2070 cells/uL (ref 850–3900)
MCH: 32.2 pg (ref 27.0–33.0)
MCHC: 33.4 g/dL (ref 32.0–36.0)
MCV: 96.5 fL (ref 80.0–100.0)
MONO ABS: 414 {cells}/uL (ref 200–950)
MPV: 10.5 fL (ref 7.5–12.5)
Monocytes Relative: 6 %
Neutro Abs: 4278 cells/uL (ref 1500–7800)
Neutrophils Relative %: 62 %
PLATELETS: 176 10*3/uL (ref 140–400)
RBC: 4.59 MIL/uL (ref 4.20–5.80)
RDW: 13.1 % (ref 11.0–15.0)
WBC: 6.9 10*3/uL (ref 3.8–10.8)

## 2016-08-07 LAB — POCT URINALYSIS DIPSTICK
BILIRUBIN UA: NEGATIVE
Blood, UA: NEGATIVE
Glucose, UA: NEGATIVE
Ketones, UA: NEGATIVE
LEUKOCYTES UA: NEGATIVE
NITRITE UA: NEGATIVE
PH UA: 7 (ref 5.0–8.0)
Protein, UA: NEGATIVE
Spec Grav, UA: 1.015 (ref 1.030–1.035)
Urobilinogen, UA: 0.2 (ref ?–2.0)

## 2016-08-07 LAB — LIPID PANEL
CHOLESTEROL: 160 mg/dL (ref <200)
HDL: 48 mg/dL (ref >40)
LDL CALC: 92 mg/dL (ref <100)
TRIGLYCERIDES: 101 mg/dL (ref <150)
Total CHOL/HDL Ratio: 3.3 Ratio (ref <5.0)
VLDL: 20 mg/dL (ref <30)

## 2016-08-07 MED ORDER — LOSARTAN POTASSIUM 100 MG PO TABS: 100.0000 mg | ORAL_TABLET | Freq: Every day | ORAL | 0 refills | Status: DC

## 2016-08-07 MED ORDER — HYDROCODONE-ACETAMINOPHEN 10-325 MG PO TABS: 1.0000 | ORAL_TABLET | Freq: Four times a day (QID) | ORAL | 0 refills | Status: DC | PRN

## 2016-08-07 NOTE — Patient Instructions (Addendum)
Norco 10/325 #20 one by mouth every 6 hours sparingly for hip pain. Increase losartan to 100 mg daily and return in 4 weeks. Lab results mailed.

## 2016-08-07 NOTE — Progress Notes (Signed)
Subjective:    Patient ID: John Garcia, male    DOB: 01/29/1966, 51 y.o.   MRN: [id number]  HPI   51 year old Male For health maintenance exam and evaluation of medical issues. He has a history of hypertension and hyperlipidemia.  He has a history of atrial fibrillation with rapid ventricular response in 2006. He was hospitalized and treated with IV Cardizem. He subsequently converted to sinus rhythm. He had a cardiac catheterization that showed no significant coronary artery disease. He's never had a further episode and is not on antiarrhythmic medication.  In 2014 he weighed 258 pounds. In 2016 he weighed 259 pounds. Now weighs 272 pounds.  History of left lower lobe pneumonia 2008.  He is allergic to bee stings.  Social history: He is divorced. 2 daughters. 2 grandchildren. Youngest daughter lives with him and is home schooled. She enjoys barrel racing and roping. Oldest daughter is married. Ex-wife is remarried. He does not smoke. Social alcohol consumption. He is a Leisure centre manager.  Family history: Mother with history of diabetes, hypertension, hyperlipidemia, obesity. Father with history of coronary artery disease, history of melanoma, hypertension, morbid obesity, sleep apnea. One sister with mild hypertension and migraine headaches.    Review of Systems has seen Dr. Rayburn Ma recently regarding left hip osteoarthritis. He will need left hip arthroplasty in the future. He had some severe pain recently. I have given him a small quantity of Norco to have on hand for severe pain. He is also on meloxicam.  He is having some issues with foot pain. Appears to have bilateral bunions.     Objective:   Physical Exam  Constitutional: He is oriented to person, place, and time. He appears well-developed and well-nourished. No distress.  HENT:  Head: Normocephalic and atraumatic.  Right Ear: External ear normal.  Left Ear: External ear normal.  Mouth/Throat: Oropharynx is clear and moist.   Eyes: Conjunctivae and EOM are normal. Pupils are equal, round, and reactive to light. Right eye exhibits no discharge. Left eye exhibits no discharge. No scleral icterus.  Neck: Neck supple. No JVD present. No thyromegaly present.  Cardiovascular: Normal rate, regular rhythm, normal heart sounds and intact distal pulses.   No murmur heard. Pulmonary/Chest: Effort normal and breath sounds normal. No respiratory distress. He has no wheezes.  Abdominal: Soft. Bowel sounds are normal. He exhibits no distension and no mass. There is no tenderness. There is no rebound.  Genitourinary:  Genitourinary Comments: Prostate normal  Musculoskeletal:  Bilateral bunions that are painful  Lymphadenopathy:    He has no cervical adenopathy.  Neurological: He is alert and oriented to person, place, and time. He has normal reflexes. No cranial nerve deficit. Coordination normal.  Skin: Skin is warm and dry. No rash noted. He is not diaphoretic.  Psychiatric: He has a normal mood and affect. His behavior is normal. Judgment and thought content normal.  Vitals reviewed.         Assessment & Plan:   Osteoarthritis of Left hip  Essential hypertension-I think control could be a bit better. Increased losartan 100 mg daily and return in 4 weeks  Hyperlipidemia-normal on lipid-lowering medication  Plan: Increase losartan to 100 mg daily and return in 4 weeks. Norco 10/325 #20 one by mouth every 6 hours sparingly for hip pain. Fasting labs drawn and pending.  BMI 33.11-needs to lose weight.  Plan: Return in 4 weeks for blood pressure check and basic metabolic panel. Otherwise return in 6 months. Encouraged diet  exercise and weight loss.

## 2016-08-22 DIAGNOSIS — M1991 Primary osteoarthritis, unspecified site: Secondary | ICD-10-CM | POA: Diagnosis not present

## 2016-08-22 DIAGNOSIS — I10 Essential (primary) hypertension: Secondary | ICD-10-CM | POA: Diagnosis not present

## 2016-08-22 DIAGNOSIS — R5383 Other fatigue: Secondary | ICD-10-CM | POA: Diagnosis not present

## 2016-08-22 DIAGNOSIS — E6609 Other obesity due to excess calories: Secondary | ICD-10-CM | POA: Diagnosis not present

## 2016-08-22 DIAGNOSIS — E782 Mixed hyperlipidemia: Secondary | ICD-10-CM | POA: Diagnosis not present

## 2016-09-04 ENCOUNTER — Ambulatory Visit: Payer: BLUE CROSS/BLUE SHIELD | Admitting: Internal Medicine

## 2016-09-04 ENCOUNTER — Telehealth: Payer: Self-pay | Admitting: Internal Medicine

## 2016-09-04 ENCOUNTER — Encounter: Payer: Self-pay | Admitting: Internal Medicine

## 2016-09-04 NOTE — Telephone Encounter (Signed)
He missed appointment today for follow-up on hypertension. He says he received a phone call from my office but says no message was left. Reminded him this was a robotic call which likely reminded him of appointment.  He is now on losartan 100 mg daily. We increased dose at last visit he was supposed to be here today for follow-up.  He says he will have his girlfriend who is a nurse check his blood pressure several times over the next week or so and give [place] a call with the results.

## 2016-10-09 ENCOUNTER — Other Ambulatory Visit (INDEPENDENT_AMBULATORY_CARE_PROVIDER_SITE_OTHER): Payer: Self-pay | Admitting: Orthopaedic Surgery

## 2016-10-09 ENCOUNTER — Other Ambulatory Visit (INDEPENDENT_AMBULATORY_CARE_PROVIDER_SITE_OTHER): Payer: Self-pay | Admitting: Physician Assistant

## 2016-10-10 NOTE — Pre-Procedure Instructions (Signed)
John Garcia  10/10/2016      [id number] [place],  - [address] [place] [id number] Phone: [phone] Fax: [phone]    Your procedure is scheduled on Tues. June 5  [facility] Admitting at 12:45 A.M.  Call this number if you have problems the morning of surgery:  [phone]   Remember:  Do not eat food or drink liquids after midnight.  Take these medicines the morning of surgery with A SIP OF WATER : zyrtec if needed,hydrocodone if needed, zantac if needd             1 week prior to surgery stop : aspirin, aleve, ibuprofen, motrin, mobic, BC Powders, Goody's, vitamins/herbal medicines.   Do not wear jewelry.  Do not wear lotions, powders, or perfumes, or deoderant.  Do not shave 48 hours prior to surgery.  Men may shave face and neck.  Do not bring valuables to the hospital.  [facility] is not responsible for any belongings or valuables.  Contacts, dentures or bridgework may not be worn into surgery.  Leave your suitcase in the car.  After surgery it may be brought to your room.  For patients admitted to the hospital, discharge time will be determined by your treatment team.  Patients discharged the day of surgery will not be allowed to drive home.    Special instructions:   [place]- Preparing For Surgery  Before surgery, you can play an important role. Because skin is not sterile, your skin needs to be as free of germs as possible. You can reduce the number of germs on your skin by washing with CHG (chlorahexidine gluconate) Soap before surgery.  CHG is an antiseptic cleaner which kills germs and bonds with the skin to continue killing germs even after washing.  Please do not use if you have an allergy to CHG or antibacterial soaps. If your skin becomes reddened/irritated stop using the CHG.  Do not shave (including legs and underarms) for at least 48 hours prior to first CHG shower. It is OK  to shave your face.  Please follow these instructions carefully.   1. Shower the NIGHT BEFORE SURGERY and the MORNING OF SURGERY with CHG.   2. If you chose to wash your hair, wash your hair first as usual with your normal shampoo.  3. After you shampoo, rinse your hair and body thoroughly to remove the shampoo.  4. Use CHG as you would any other liquid soap. You can apply CHG directly to the skin and wash gently with a scrungie or a clean washcloth.   5. Apply the CHG Soap to your body ONLY FROM THE NECK DOWN.  Do not use on open wounds or open sores. Avoid contact with your eyes, ears, mouth and genitals (private parts). Wash genitals (private parts) with your normal soap.  6. Wash thoroughly, paying special attention to the area where your surgery will be performed.  7. Thoroughly rinse your body with warm water from the neck down.  8. DO NOT shower/wash with your normal soap after using and rinsing off the CHG Soap.  9. Pat yourself dry with a CLEAN TOWEL.   10. Wear CLEAN PAJAMAS   11. Place CLEAN SHEETS on your bed the night of your first shower and DO NOT SLEEP WITH PETS.    Day of Surgery: Do not apply any deodorants/lotions. Please wear clean clothes to the hospital/surgery  center.      Please read over the following fact sheets that you were given. Coughing and Deep Breathing, MRSA Information and Surgical Site Infection Prevention

## 2016-10-11 ENCOUNTER — Encounter (HOSPITAL_COMMUNITY)
Admission: RE | Admit: 2016-10-11 | Discharge: 2016-10-11 | Disposition: A | Discharge status: Home / Self Care | Payer: BLUE CROSS/BLUE SHIELD | Source: Ambulatory Visit | Attending: Orthopaedic Surgery | Admitting: Orthopaedic Surgery

## 2016-10-11 ENCOUNTER — Encounter (HOSPITAL_COMMUNITY): Payer: Self-pay

## 2016-10-11 DIAGNOSIS — M1612 Unilateral primary osteoarthritis, left hip: Secondary | ICD-10-CM | POA: Diagnosis not present

## 2016-10-11 DIAGNOSIS — Z01818 Encounter for other preprocedural examination: Secondary | ICD-10-CM | POA: Diagnosis not present

## 2016-10-11 DIAGNOSIS — Z01810 Encounter for preprocedural cardiovascular examination: Secondary | ICD-10-CM | POA: Diagnosis not present

## 2016-10-11 HISTORY — DX: Personal history of other diseases of the digestive system: Z87.19

## 2016-10-11 HISTORY — DX: Essential (primary) hypertension: I10

## 2016-10-11 HISTORY — DX: Unspecified osteoarthritis, unspecified site: M19.90

## 2016-10-11 HISTORY — DX: Gastro-esophageal reflux disease without esophagitis: K21.9

## 2016-10-11 LAB — CBC
HCT: 42.4 % (ref 39.0–52.0)
Hemoglobin: 14.3 g/dL (ref 13.0–17.0)
MCH: 31.8 pg (ref 26.0–34.0)
MCHC: 33.7 g/dL (ref 30.0–36.0)
MCV: 94.4 fL (ref 78.0–100.0)
PLATELETS: 168 10*3/uL (ref 150–400)
RBC: 4.49 MIL/uL (ref 4.22–5.81)
RDW: 12.6 % (ref 11.5–15.5)
WBC: 6.8 10*3/uL (ref 4.0–10.5)

## 2016-10-11 LAB — SURGICAL PCR SCREEN
MRSA, PCR: NEGATIVE
STAPHYLOCOCCUS AUREUS: POSITIVE — AB

## 2016-10-11 LAB — BASIC METABOLIC PANEL
Anion gap: 7 (ref 5–15)
BUN: 18 mg/dL (ref 6–20)
CALCIUM: 8.8 mg/dL — AB (ref 8.9–10.3)
CO2: 23 mmol/L (ref 22–32)
CREATININE: 0.89 mg/dL (ref 0.61–1.24)
Chloride: 109 mmol/L (ref 101–111)
Glucose, Bld: 93 mg/dL (ref 65–99)
Potassium: 4.4 mmol/L (ref 3.5–5.1)
Sodium: 139 mmol/L (ref 135–145)

## 2016-10-11 NOTE — Progress Notes (Addendum)
PCP: Dr. Alfredia ClientMary Jo Baxley  Pt. Reported he had been taking diet pills (phentermine) for a couple of days and started feeling palpitations. Stated he stopped the pills 2 weeks ago and the palpitations stopped. Pt. Stated this happened to him 10 yrs. Ago when drinking a lot of caffiene,chewing tobacco and taking allergy meds. Stated he was [facility].   Rica MastAngela Kabbe, NP notified.  [facility] [place]

## 2016-10-11 NOTE — Progress Notes (Signed)
Anesthesia Chart Review:  Pt is a 51 year old male scheduled for L total hip arthroplasty anterior approach on 10/16/2016 with Doneen Poissonhristopher Blackman, M.D.  Called to see pt in PAT for c/o palpitations. Pt with history afib, onset 2006. Pt reports he was consuming excessive caffeine and chewing tobacco at that time; sx resolved when he stopped those things.  Two weeks ago, began having palpitations again.  Girlfriend is a Engineer, civil (consulting)nurse, checked his pulse and found it rapid and irregular.  Pt had been taking phentermine; he determined phentermine must be the cause of his palpitations and has stopped it.  No palpitations since.  Has not seen cardiology in years.  NSR today.     - PCP is Marlan PalauMary Baxley, M.D.  [facility]: Atrial flutter, atrial fibrillation (onset 2006), HTN, hyperlipidemia, GERD. Never smoker. BMI 34.  Medications include: ASA 81 mg, Lipitor, losartan, Zantac  BP (!) 157/91   Pulse 70   Temp 36.6 C   Resp 20   Ht 6\' 4"  (1.93 m)   Wt 278 lb (126.1 kg)   SpO2 98%   BMI 33.84 kg/m    Preoperative labs reviewed.  EKG 10/11/16: NSR.  Cardiac cath 05/02/05:  1.  No significant coronary artery disease. 2.  Mildly depressed left ventricular systolic function. 3.  No evidence of renal artery stenosis. 4.  Systemic hypertension.  Reviewed case with Dr. Desmond Lopeurk. If no changes, I anticipate pt can proceed with surgery as scheduled.   Rica Mastngela Kayhan Boardley, FNP-BC [facility]/Anesthesiology Phone: [phone] 10/11/2016 4:17 PM

## 2016-10-15 MED ORDER — TRANEXAMIC ACID 1000 MG/10ML IV SOLN
1000.0000 mg | INTRAVENOUS | Status: AC
  Administered 2016-10-16: 1000 mg via INTRAVENOUS
  Filled 2016-10-15: qty 10

## 2016-10-15 MED ORDER — CEFAZOLIN SODIUM-DEXTROSE 2-4 GM/100ML-% IV SOLN: 2.0000 g | INTRAVENOUS | Status: DC

## 2016-10-15 MED ORDER — TRANEXAMIC ACID 1000 MG/10ML IV SOLN
1000.0000 mg | INTRAVENOUS | Status: DC
  Filled 2016-10-15: qty 10

## 2016-10-15 MED ORDER — DEXTROSE 5 % IV SOLN
3.0000 g | INTRAVENOUS | Status: AC
  Administered 2016-10-16: 2 g via INTRAVENOUS
  Filled 2016-10-15: qty 3000

## 2016-10-16 ENCOUNTER — Inpatient Hospital Stay (HOSPITAL_COMMUNITY)
Admission: RE | Admit: 2016-10-16 | Discharge: 2016-10-19 | DRG: 470 | Disposition: A | Discharge status: Home Health | Payer: BLUE CROSS/BLUE SHIELD | Source: Ambulatory Visit | Attending: Orthopaedic Surgery | Admitting: Orthopaedic Surgery

## 2016-10-16 ENCOUNTER — Inpatient Hospital Stay (HOSPITAL_COMMUNITY): Payer: BLUE CROSS/BLUE SHIELD

## 2016-10-16 ENCOUNTER — Encounter (HOSPITAL_COMMUNITY)
Admission: RE | Disposition: A | Discharge status: Home Health | Payer: Self-pay | Source: Ambulatory Visit | Attending: Orthopaedic Surgery

## 2016-10-16 ENCOUNTER — Encounter (HOSPITAL_COMMUNITY): Payer: Self-pay | Admitting: Anesthesiology

## 2016-10-16 ENCOUNTER — Inpatient Hospital Stay (HOSPITAL_COMMUNITY): Payer: BLUE CROSS/BLUE SHIELD | Admitting: Anesthesiology

## 2016-10-16 ENCOUNTER — Inpatient Hospital Stay (HOSPITAL_COMMUNITY): Payer: BLUE CROSS/BLUE SHIELD | Admitting: Emergency Medicine

## 2016-10-16 DIAGNOSIS — Z6833 Body mass index (BMI) 33.0-33.9, adult: Secondary | ICD-10-CM | POA: Diagnosis not present

## 2016-10-16 DIAGNOSIS — Z96642 Presence of left artificial hip joint: Secondary | ICD-10-CM

## 2016-10-16 DIAGNOSIS — Z7982 Long term (current) use of aspirin: Secondary | ICD-10-CM | POA: Diagnosis not present

## 2016-10-16 DIAGNOSIS — M1612 Unilateral primary osteoarthritis, left hip: Principal | ICD-10-CM | POA: Diagnosis present

## 2016-10-16 DIAGNOSIS — Z91041 Radiographic dye allergy status: Secondary | ICD-10-CM

## 2016-10-16 DIAGNOSIS — Z419 Encounter for procedure for purposes other than remedying health state, unspecified: Secondary | ICD-10-CM

## 2016-10-16 DIAGNOSIS — M25552 Pain in left hip: Secondary | ICD-10-CM | POA: Diagnosis not present

## 2016-10-16 DIAGNOSIS — Z91030 Bee allergy status: Secondary | ICD-10-CM

## 2016-10-16 DIAGNOSIS — M1990 Unspecified osteoarthritis, unspecified site: Secondary | ICD-10-CM | POA: Diagnosis not present

## 2016-10-16 DIAGNOSIS — E785 Hyperlipidemia, unspecified: Secondary | ICD-10-CM | POA: Diagnosis not present

## 2016-10-16 DIAGNOSIS — Z87891 Personal history of nicotine dependence: Secondary | ICD-10-CM

## 2016-10-16 DIAGNOSIS — Z91013 Allergy to seafood: Secondary | ICD-10-CM | POA: Diagnosis not present

## 2016-10-16 DIAGNOSIS — I4892 Unspecified atrial flutter: Secondary | ICD-10-CM | POA: Diagnosis not present

## 2016-10-16 DIAGNOSIS — Z8249 Family history of ischemic heart disease and other diseases of the circulatory system: Secondary | ICD-10-CM | POA: Diagnosis not present

## 2016-10-16 DIAGNOSIS — I10 Essential (primary) hypertension: Secondary | ICD-10-CM | POA: Diagnosis not present

## 2016-10-16 DIAGNOSIS — Z791 Long term (current) use of non-steroidal anti-inflammatories (NSAID): Secondary | ICD-10-CM | POA: Diagnosis not present

## 2016-10-16 DIAGNOSIS — K219 Gastro-esophageal reflux disease without esophagitis: Secondary | ICD-10-CM | POA: Diagnosis not present

## 2016-10-16 DIAGNOSIS — Z471 Aftercare following joint replacement surgery: Secondary | ICD-10-CM | POA: Diagnosis not present

## 2016-10-16 DIAGNOSIS — R269 Unspecified abnormalities of gait and mobility: Secondary | ICD-10-CM | POA: Diagnosis not present

## 2016-10-16 DIAGNOSIS — E669 Obesity, unspecified: Secondary | ICD-10-CM | POA: Diagnosis not present

## 2016-10-16 HISTORY — PX: TOTAL HIP ARTHROPLASTY: SHX124

## 2016-10-16 SURGERY — ARTHROPLASTY, HIP, TOTAL, ANTERIOR APPROACH
Anesthesia: Spinal | Site: Hip | Laterality: Left

## 2016-10-16 MED ORDER — FENTANYL CITRATE (PF) 100 MCG/2ML IJ SOLN: 25.0000 ug | INTRAMUSCULAR | Status: DC | PRN

## 2016-10-16 MED ORDER — METOCLOPRAMIDE HCL 5 MG PO TABS: 5.0000 mg | ORAL_TABLET | Freq: Three times a day (TID) | ORAL | Status: DC | PRN

## 2016-10-16 MED ORDER — CHLORHEXIDINE GLUCONATE 4 % EX LIQD: 60.0000 mL | Freq: Once | CUTANEOUS | Status: DC

## 2016-10-16 MED ORDER — ONDANSETRON HCL 4 MG/2ML IJ SOLN
INTRAMUSCULAR | Status: AC
  Filled 2016-10-16: qty 2

## 2016-10-16 MED ORDER — DOCUSATE SODIUM 100 MG PO CAPS
100.0000 mg | ORAL_CAPSULE | Freq: Two times a day (BID) | ORAL | Status: DC
  Administered 2016-10-16 – 2016-10-19 (×6): 100 mg via ORAL
  Filled 2016-10-16 (×6): qty 1

## 2016-10-16 MED ORDER — 0.9 % SODIUM CHLORIDE (POUR BTL) OPTIME
TOPICAL | Status: DC | PRN
  Administered 2016-10-16: 1000 mL

## 2016-10-16 MED ORDER — PHENYLEPHRINE 40 MCG/ML (10ML) SYRINGE FOR IV PUSH (FOR BLOOD PRESSURE SUPPORT)
PREFILLED_SYRINGE | INTRAVENOUS | Status: AC
  Filled 2016-10-16: qty 10

## 2016-10-16 MED ORDER — PHENYLEPHRINE HCL 10 MG/ML IJ SOLN
INTRAVENOUS | Status: DC | PRN
  Administered 2016-10-16: 25 ug/min via INTRAVENOUS

## 2016-10-16 MED ORDER — ACETAMINOPHEN 325 MG PO TABS
650.0000 mg | ORAL_TABLET | Freq: Four times a day (QID) | ORAL | Status: DC | PRN
  Administered 2016-10-18: 650 mg via ORAL
  Filled 2016-10-16: qty 2

## 2016-10-16 MED ORDER — POLYETHYLENE GLYCOL 3350 17 G PO PACK: 17.0000 g | PACK | Freq: Every day | ORAL | Status: DC | PRN

## 2016-10-16 MED ORDER — FENTANYL CITRATE (PF) 100 MCG/2ML IJ SOLN
INTRAMUSCULAR | Status: DC | PRN
  Administered 2016-10-16 (×2): 50 ug via INTRAVENOUS

## 2016-10-16 MED ORDER — SODIUM CHLORIDE 0.9 % IV SOLN
INTRAVENOUS | Status: DC
  Administered 2016-10-16 – 2016-10-17 (×2): via INTRAVENOUS

## 2016-10-16 MED ORDER — METOCLOPRAMIDE HCL 5 MG/ML IJ SOLN: 5.0000 mg | Freq: Three times a day (TID) | INTRAMUSCULAR | Status: DC | PRN

## 2016-10-16 MED ORDER — ALUM & MAG HYDROXIDE-SIMETH 200-200-20 MG/5ML PO SUSP: 30.0000 mL | ORAL | Status: DC | PRN

## 2016-10-16 MED ORDER — PHENOL 1.4 % MT LIQD: 1.0000 | OROMUCOSAL | Status: DC | PRN

## 2016-10-16 MED ORDER — ONDANSETRON HCL 4 MG PO TABS: 4.0000 mg | ORAL_TABLET | Freq: Four times a day (QID) | ORAL | Status: DC | PRN

## 2016-10-16 MED ORDER — DIPHENHYDRAMINE HCL 12.5 MG/5ML PO ELIX: 12.5000 mg | ORAL_SOLUTION | ORAL | Status: DC | PRN

## 2016-10-16 MED ORDER — PROPOFOL 10 MG/ML IV BOLUS
INTRAVENOUS | Status: DC | PRN
  Administered 2016-10-16: 20 mg via INTRAVENOUS

## 2016-10-16 MED ORDER — OXYCODONE HCL 5 MG/5ML PO SOLN: 5.0000 mg | Freq: Once | ORAL | Status: DC | PRN

## 2016-10-16 MED ORDER — ONDANSETRON HCL 4 MG/2ML IJ SOLN: 4.0000 mg | Freq: Four times a day (QID) | INTRAMUSCULAR | Status: DC | PRN

## 2016-10-16 MED ORDER — MIDAZOLAM HCL 5 MG/5ML IJ SOLN
INTRAMUSCULAR | Status: DC | PRN
  Administered 2016-10-16: 2 mg via INTRAVENOUS

## 2016-10-16 MED ORDER — LOSARTAN POTASSIUM 50 MG PO TABS: 100.0000 mg | ORAL_TABLET | Freq: Every day | ORAL | Status: DC

## 2016-10-16 MED ORDER — HYDROMORPHONE HCL 1 MG/ML IJ SOLN
1.0000 mg | INTRAMUSCULAR | Status: DC | PRN
  Administered 2016-10-16 – 2016-10-18 (×9): 1 mg via INTRAVENOUS
  Filled 2016-10-16 (×10): qty 1

## 2016-10-16 MED ORDER — ATORVASTATIN CALCIUM 20 MG PO TABS
20.0000 mg | ORAL_TABLET | Freq: Every day | ORAL | Status: DC
  Administered 2016-10-17 – 2016-10-18 (×2): 20 mg via ORAL
  Filled 2016-10-16 (×2): qty 1

## 2016-10-16 MED ORDER — OXYCODONE HCL 5 MG PO TABS: 5.0000 mg | ORAL_TABLET | Freq: Once | ORAL | Status: DC | PRN

## 2016-10-16 MED ORDER — ONDANSETRON HCL 4 MG/2ML IJ SOLN: 4.0000 mg | Freq: Once | INTRAMUSCULAR | Status: DC | PRN

## 2016-10-16 MED ORDER — PROPOFOL 500 MG/50ML IV EMUL
INTRAVENOUS | Status: DC | PRN
  Administered 2016-10-16: 75 ug/kg/min via INTRAVENOUS

## 2016-10-16 MED ORDER — PHENYLEPHRINE HCL 10 MG/ML IJ SOLN
INTRAMUSCULAR | Status: DC | PRN
  Administered 2016-10-16 (×2): 80 ug via INTRAVENOUS

## 2016-10-16 MED ORDER — LACTATED RINGERS IV SOLN
INTRAVENOUS | Status: DC
  Administered 2016-10-16 (×2): via INTRAVENOUS

## 2016-10-16 MED ORDER — DEXTROSE 5 % IV SOLN: 500.0000 mg | Freq: Four times a day (QID) | INTRAVENOUS | Status: DC | PRN

## 2016-10-16 MED ORDER — ACETAMINOPHEN 650 MG RE SUPP: 650.0000 mg | Freq: Four times a day (QID) | RECTAL | Status: DC | PRN

## 2016-10-16 MED ORDER — KETOROLAC TROMETHAMINE 15 MG/ML IJ SOLN
7.5000 mg | Freq: Four times a day (QID) | INTRAMUSCULAR | Status: AC
  Administered 2016-10-16 – 2016-10-17 (×4): 7.5 mg via INTRAVENOUS
  Filled 2016-10-16 (×4): qty 1

## 2016-10-16 MED ORDER — LOSARTAN POTASSIUM 50 MG PO TABS
100.0000 mg | ORAL_TABLET | Freq: Every day | ORAL | Status: DC
  Administered 2016-10-17 – 2016-10-19 (×3): 100 mg via ORAL
  Filled 2016-10-16 (×3): qty 2

## 2016-10-16 MED ORDER — FENTANYL CITRATE (PF) 250 MCG/5ML IJ SOLN
INTRAMUSCULAR | Status: AC
  Filled 2016-10-16: qty 5

## 2016-10-16 MED ORDER — MENTHOL 3 MG MT LOZG: 1.0000 | LOZENGE | OROMUCOSAL | Status: DC | PRN

## 2016-10-16 MED ORDER — SODIUM CHLORIDE 0.9 % IR SOLN
Status: DC | PRN
  Administered 2016-10-16: 3000 mL

## 2016-10-16 MED ORDER — ASPIRIN 81 MG PO CHEW
81.0000 mg | CHEWABLE_TABLET | Freq: Two times a day (BID) | ORAL | Status: DC
  Administered 2016-10-16 – 2016-10-19 (×6): 81 mg via ORAL
  Filled 2016-10-16 (×6): qty 1

## 2016-10-16 MED ORDER — METHOCARBAMOL 500 MG PO TABS
500.0000 mg | ORAL_TABLET | Freq: Four times a day (QID) | ORAL | Status: DC | PRN
  Administered 2016-10-17 – 2016-10-19 (×8): 500 mg via ORAL
  Filled 2016-10-16 (×8): qty 1

## 2016-10-16 MED ORDER — OXYCODONE HCL 5 MG PO TABS
5.0000 mg | ORAL_TABLET | ORAL | Status: DC | PRN
  Administered 2016-10-16 – 2016-10-19 (×13): 10 mg via ORAL
  Filled 2016-10-16 (×14): qty 2

## 2016-10-16 MED ORDER — CEFAZOLIN SODIUM-DEXTROSE 2-4 GM/100ML-% IV SOLN
2.0000 g | Freq: Four times a day (QID) | INTRAVENOUS | Status: AC
  Administered 2016-10-16 – 2016-10-17 (×2): 2 g via INTRAVENOUS
  Filled 2016-10-16 (×2): qty 100

## 2016-10-16 MED ORDER — MIDAZOLAM HCL 2 MG/2ML IJ SOLN
INTRAMUSCULAR | Status: AC
  Filled 2016-10-16: qty 2

## 2016-10-16 MED ORDER — ONDANSETRON HCL 4 MG/2ML IJ SOLN
INTRAMUSCULAR | Status: DC | PRN
  Administered 2016-10-16: 4 mg via INTRAVENOUS

## 2016-10-16 SURGICAL SUPPLY — 52 items
APL SKNCLS STERI-STRIP NONHPOA (GAUZE/BANDAGES/DRESSINGS) ×1
BENZOIN TINCTURE PRP APPL 2/3 (GAUZE/BANDAGES/DRESSINGS) ×2 IMPLANT
BLADE CLIPPER SURG (BLADE) IMPLANT
BLADE SAW SGTL 18X1.27X75 (BLADE) ×2 IMPLANT
CAPT HIP TOTAL 2 ×1 IMPLANT
CELLS DAT CNTRL 66122 CELL SVR (MISCELLANEOUS) ×1 IMPLANT
COVER SURGICAL LIGHT HANDLE (MISCELLANEOUS) ×2 IMPLANT
DRAPE C-ARM 42X72 X-RAY (DRAPES) ×2 IMPLANT
DRAPE STERI IOBAN 125X83 (DRAPES) ×2 IMPLANT
DRAPE U-SHAPE 47X51 STRL (DRAPES) ×6 IMPLANT
DRSG AQUACEL AG ADV 3.5X10 (GAUZE/BANDAGES/DRESSINGS) ×2 IMPLANT
DURAPREP 26ML APPLICATOR (WOUND CARE) ×2 IMPLANT
ELECT BLADE 4.0 EZ CLEAN MEGAD (MISCELLANEOUS) ×2
ELECT BLADE 6.5 EXT (BLADE) IMPLANT
ELECT REM PT RETURN 9FT ADLT (ELECTROSURGICAL) ×2
ELECTRODE BLDE 4.0 EZ CLN MEGD (MISCELLANEOUS) ×1 IMPLANT
ELECTRODE REM PT RTRN 9FT ADLT (ELECTROSURGICAL) ×1 IMPLANT
FACESHIELD WRAPAROUND (MASK) ×4 IMPLANT
FACESHIELD WRAPAROUND OR TEAM (MASK) ×2 IMPLANT
GLOVE BIOGEL PI IND STRL 8 (GLOVE) ×2 IMPLANT
GLOVE BIOGEL PI INDICATOR 8 (GLOVE) ×2
GLOVE ECLIPSE 8.0 STRL XLNG CF (GLOVE) ×3 IMPLANT
GLOVE ORTHO TXT STRL SZ7.5 (GLOVE) ×3 IMPLANT
GOWN STRL REUS W/ TWL LRG LVL3 (GOWN DISPOSABLE) ×2 IMPLANT
GOWN STRL REUS W/ TWL XL LVL3 (GOWN DISPOSABLE) ×2 IMPLANT
GOWN STRL REUS W/TWL LRG LVL3 (GOWN DISPOSABLE) ×8
GOWN STRL REUS W/TWL XL LVL3 (GOWN DISPOSABLE) ×4
HANDPIECE INTERPULSE COAX TIP (DISPOSABLE) ×2
KIT BASIN OR (CUSTOM PROCEDURE TRAY) ×2 IMPLANT
KIT ROOM TURNOVER OR (KITS) ×2 IMPLANT
MANIFOLD NEPTUNE II (INSTRUMENTS) ×2 IMPLANT
NS IRRIG 1000ML POUR BTL (IV SOLUTION) ×2 IMPLANT
PACK TOTAL JOINT (CUSTOM PROCEDURE TRAY) ×2 IMPLANT
PAD ARMBOARD 7.5X6 YLW CONV (MISCELLANEOUS) ×2 IMPLANT
RETRACTOR WND ALEXIS 18 MED (MISCELLANEOUS) ×1 IMPLANT
RTRCTR WOUND ALEXIS 18CM MED (MISCELLANEOUS) ×2
SET HNDPC FAN SPRY TIP SCT (DISPOSABLE) ×1 IMPLANT
STAPLER VISISTAT 35W (STAPLE) IMPLANT
STRIP CLOSURE SKIN 1/2X4 (GAUZE/BANDAGES/DRESSINGS) ×3 IMPLANT
SUT ETHIBOND NAB CT1 #1 30IN (SUTURE) ×2 IMPLANT
SUT MNCRL AB 4-0 PS2 18 (SUTURE) IMPLANT
SUT VIC AB 0 CT1 27 (SUTURE) ×4
SUT VIC AB 0 CT1 27XBRD ANBCTR (SUTURE) ×1 IMPLANT
SUT VIC AB 1 CT1 27 (SUTURE) ×4
SUT VIC AB 1 CT1 27XBRD ANBCTR (SUTURE) ×1 IMPLANT
SUT VIC AB 2-0 CT1 27 (SUTURE) ×4
SUT VIC AB 2-0 CT1 TAPERPNT 27 (SUTURE) ×1 IMPLANT
TOWEL OR 17X24 6PK STRL BLUE (TOWEL DISPOSABLE) ×2 IMPLANT
TOWEL OR 17X26 10 PK STRL BLUE (TOWEL DISPOSABLE) ×2 IMPLANT
TRAY CATH 16FR W/PLASTIC CATH (SET/KITS/TRAYS/PACK) ×1 IMPLANT
TRAY FOLEY W/METER SILVER 16FR (SET/KITS/TRAYS/PACK) IMPLANT
WATER STERILE IRR 1000ML POUR (IV SOLUTION) ×3 IMPLANT

## 2016-10-16 NOTE — H&P (Signed)
TOTAL HIP ADMISSION H&P  Patient is admitted for left total hip arthroplasty.  Subjective:  Chief Complaint: left hip pain  HPI: John Garcia, 50 y.o. male, has a history of pain and functional disability in the left hip(s) due to arthritis and patient has failed non-surgical conservative treatments for greater than 12 weeks to include NSAID's and/or analgesics, corticosteriod injections, flexibility and strengthening excercises, use of assistive devices, weight reduction as appropriate and activity modification.  Onset of symptoms was gradual starting 3 years ago with gradually worsening course since that time.The patient noted no past surgery on the left hip(s).  Patient currently rates pain in the left hip at 10 out of 10 with activity. Patient has night pain, worsening of pain with activity and weight bearing, trendelenberg gait, pain that interfers with activities of daily living, pain with passive range of motion and crepitus. Patient has evidence of subchondral sclerosis, periarticular osteophytes and joint space narrowing by imaging studies. This condition presents safety issues increasing the risk of falls.  There is no current active infection.  Patient Active Problem List   Diagnosis Date Noted  . Unilateral primary osteoarthritis, left hip 10/16/2016  . Essential hypertension 02/09/2015  . Hyperlipidemia 01/10/2011   Past Medical History:  Diagnosis Date  . Arthritis   . Atrial flutter with rapid ventricular response (HCC)   . GERD (gastroesophageal reflux disease)   . History of hiatal hernia   . Hyperlipidemia   . Hypertension   . Obesity     No past surgical history on file.  Prescriptions Prior to Admission  Medication Sig Dispense Refill Last Dose  . aspirin 81 MG chewable tablet Chew 81 mg by mouth daily.   Taking  . atorvastatin (LIPITOR) 20 MG tablet Take 1 tablet (20 mg total) by mouth daily. 90 tablet 3 Taking  . cetirizine (ZYRTEC) 10 MG tablet Take 10 mg by  mouth daily as needed for allergies.     . Glucosamine HCl (GLUCOSAMINE PO) Take 2 tablets by mouth daily.     Marland Kitchen ibuprofen (ADVIL,MOTRIN) 200 MG tablet Take 800 mg by mouth every 6 (six) hours as needed for moderate pain.    Taking  . losartan (COZAAR) 100 MG tablet Take 1 tablet (100 mg total) by mouth daily. 90 tablet 0   . meloxicam (MOBIC) 15 MG tablet TAKE 1 TABLET (15 MG TOTAL) BY MOUTH DAILY. (Patient taking differently: Take 15 mg by mouth daily. TAKE 1 TABLET (15 MG TOTAL) BY MOUTH DAILY.) 90 tablet 3 Taking  . Multiple Vitamin (MULTIVITAMIN WITH MINERALS) TABS tablet Take 1 tablet by mouth daily.     . naproxen sodium (ANAPROX) 220 MG tablet Take 440 mg by mouth 2 (two) times daily as needed (pain).    Taking  . RaNITidine HCl (ZANTAC PO) Take 1 tablet by mouth daily as needed (heartburn).     Marland Kitchen HYDROcodone-acetaminophen (NORCO) 10-325 MG tablet Take 1 tablet by mouth every 6 (six) hours as needed. (Patient not taking: Reported on 10/05/2016) 20 tablet 0 Not Taking at Unknown time   Allergies  Allergen Reactions  . Bee Venom Anaphylaxis  . Ivp Dye [Iodinated Diagnostic Agents] Hives and Shortness Of Breath  . Shellfish Allergy Rash    Throat itches Takes Benadryl and can eat shrimp     Social History  Substance Use Topics  . Smoking status: Never Smoker  . Smokeless tobacco: Former Neurosurgeon    Quit date: 10/12/2006  . Alcohol use Yes  Comment: coke and jack daniels 4x a week (couple oz.)    Family History  Problem Relation Age of Onset  . Hypertension Mother      Review of Systems  Musculoskeletal: Positive for back pain and joint pain.  All other systems reviewed and are negative.   Objective:  Physical Exam  Constitutional: He is oriented to person, place, and time. He appears well-developed and well-nourished.  HENT:  Head: Normocephalic and atraumatic.  Eyes: EOM are normal. Pupils are equal, round, and reactive to light.  Neck: Normal range of motion. Neck  supple.  Cardiovascular: Normal rate and regular rhythm.   Respiratory: Effort normal and breath sounds normal.  GI: Soft. Bowel sounds are normal.  Musculoskeletal:       Left hip: He exhibits decreased range of motion, decreased strength, tenderness and bony tenderness.  Neurological: He is alert and oriented to person, place, and time.  Skin: Skin is warm and dry.  Psychiatric: He has a normal mood and affect.    Vital signs in last 24 hours:    Labs:   Estimated body mass index is 33.84 kg/m as calculated from the following:   Height as of 10/11/16: 6\' 4"  (1.93 m).   Weight as of 10/11/16: 278 lb (126.1 kg).   Imaging Review Plain radiographs demonstrate severe degenerative joint disease of the left hip(s). The bone quality appears to be good for age and reported activity level.  Assessment/Plan:  End stage arthritis, left hip(s)  The patient history, physical examination, clinical judgement of the provider and imaging studies are consistent with end stage degenerative joint disease of the left hip(s) and total hip arthroplasty is deemed medically necessary. The treatment options including medical management, injection therapy, arthroscopy and arthroplasty were discussed at length. The risks and benefits of total hip arthroplasty were presented and reviewed. The risks due to aseptic loosening, infection, stiffness, dislocation/subluxation,  thromboembolic complications and other imponderables were discussed.  The patient acknowledged the explanation, agreed to proceed with the plan and consent was signed. Patient is being admitted for inpatient treatment for surgery, pain control, PT, OT, prophylactic antibiotics, VTE prophylaxis, progressive ambulation and ADL's and discharge planning.The patient is planning to be discharged home with home health services

## 2016-10-16 NOTE — Anesthesia Procedure Notes (Signed)
Spinal  Start time: 10/16/2016 3:05 PM End time: 10/16/2016 3:10 PM Staffing Anesthesiologist: Noreene LarssonJOSLIN, Shanteria Laye Performed: anesthesiologist  Preanesthetic Checklist Completed: patient identified, site marked, surgical consent, pre-op evaluation, timeout performed, IV checked, risks and benefits discussed and monitors and equipment checked Spinal Block Patient position: sitting Prep: ChloraPrep Patient monitoring: heart rate, cardiac monitor, continuous pulse ox and blood pressure Approach: midline Location: L3-4 Needle Needle type: Quincke  Needle gauge: 24 G Needle length: 9 cm Assessment Sensory level: T6 Additional Notes 14 mg 0.75% Bupivacaine injected easily

## 2016-10-16 NOTE — Anesthesia Preprocedure Evaluation (Addendum)
Anesthesia Evaluation  Patient identified by MRN, date of birth, ID band Patient awake    Reviewed: Allergy & Precautions, NPO status , Patient's Chart, lab work & pertinent test results  Airway Mallampati: II  TM Distance: >3 FB Neck ROM: Full    Dental  (+) Teeth Intact, Dental Advisory Given   Pulmonary    breath sounds clear to auscultation       Cardiovascular hypertension,  Rhythm:Regular Rate:Normal     Neuro/Psych    GI/Hepatic   Endo/Other    Renal/GU      Musculoskeletal   Abdominal (+) + obese,   Peds  Hematology   Anesthesia Other Findings   Reproductive/Obstetrics                            Anesthesia Physical Anesthesia Plan  ASA: II  Anesthesia Plan: Spinal   Post-op Pain Management:    Induction: Intravenous  PONV Risk Score and Plan: Ondansetron and Propofol  Airway Management Planned: Natural Airway and Simple Face Mask  Additional Equipment:   Intra-op Plan:   Post-operative Plan:   Informed Consent: I have reviewed the patients History and Physical, chart, labs and discussed the procedure including the risks, benefits and alternatives for the proposed anesthesia with the patient or authorized representative who has indicated his/her understanding and acceptance.   Dental advisory given  Plan Discussed with: CRNA and Anesthesiologist  Anesthesia Plan Comments:         Anesthesia Quick Evaluation

## 2016-10-16 NOTE — Anesthesia Procedure Notes (Signed)
Procedure Name: MAC Date/Time: 10/16/2016 3:15 PM Performed by: Jenne Campus Pre-anesthesia Checklist: Patient identified, Emergency Drugs available, Suction available, Patient being monitored and Timeout performed Oxygen Delivery Method: Simple face mask

## 2016-10-16 NOTE — Anesthesia Postprocedure Evaluation (Addendum)
Anesthesia Post Note  Patient: John Garcia  Procedure(s) Performed: Procedure(s) (LRB): LEFT TOTAL HIP ARTHROPLASTY ANTERIOR APPROACH (Left)     Patient location during evaluation: PACU Anesthesia Type: Spinal Level of consciousness: awake and alert and oriented Pain management: pain level controlled Vital Signs Assessment: post-procedure vital signs reviewed and stable Respiratory status: spontaneous breathing, nonlabored ventilation and respiratory function stable Cardiovascular status: blood pressure returned to baseline and stable Postop Assessment: no headache, no backache, spinal receding and no signs of nausea or vomiting Anesthetic complications: no    Last Vitals:  Vitals:   10/16/16 1730 10/16/16 1745  BP: 104/60 111/72  Pulse: (!) 58 (!) 56  Resp: 10 16  Temp:      Last Pain:  Vitals:   10/16/16 1730  TempSrc:   PainSc: 0-No pain                 Mahlani Berninger A.

## 2016-10-16 NOTE — Brief Op Note (Signed)
10/16/2016  4:37 PM  PATIENT:  Baxter KailBenjamin W Throgmorton  51 y.o. male  PRE-OPERATIVE DIAGNOSIS:  osteoarthritis left hip  POST-OPERATIVE DIAGNOSIS:  osteoarthritis left hip  PROCEDURE:  Procedure(s): LEFT TOTAL HIP ARTHROPLASTY ANTERIOR APPROACH (Left)  SURGEON:  Surgeon(s) and Role:    Kathryne Hitch* Vonzella Althaus Y, MD - Primary  PHYSICIAN ASSISTANT: Rexene EdisonGil Clark, PA-C  ANESTHESIA:   spinal  EBL:  Total I/O In: 1000 [I.V.:1000] Out: 200 [Blood:200]  COUNTS:  YES  DICTATION: .Other Dictation: Dictation Number [id number]  PLAN OF CARE: Admit to inpatient   PATIENT DISPOSITION:  PACU - hemodynamically stable.   Delay start of Pharmacological VTE agent (>24hrs) due to surgical blood loss or risk of bleeding: no

## 2016-10-16 NOTE — Transfer of Care (Signed)
Immediate Anesthesia Transfer of Care Note  Patient: John Garcia  Procedure(s) Performed: Procedure(s): LEFT TOTAL HIP ARTHROPLASTY ANTERIOR APPROACH (Left)  Patient Location: PACU  Anesthesia Type:MAC and Spinal  Level of Consciousness: awake, alert , oriented and patient cooperative  Airway & Oxygen Therapy: Patient Spontanous Breathing  Post-op Assessment: Report given to RN and Post -op Vital signs reviewed and stable  Post vital signs: Reviewed and stable  Last Vitals:  Vitals:   10/16/16 1659 10/16/16 1700  BP:  (!) (P) 91/50  Pulse:  75  Resp:  13  Temp: 36.6 C     Last Pain:  Vitals:   10/16/16 1700  TempSrc:   PainSc: 0-No pain      Patients Stated Pain Goal: 3 (09/38/18 2993)  Complications: No apparent anesthesia complications

## 2016-10-17 ENCOUNTER — Encounter (HOSPITAL_COMMUNITY): Payer: Self-pay | Admitting: Orthopaedic Surgery

## 2016-10-17 LAB — BASIC METABOLIC PANEL
ANION GAP: 8 (ref 5–15)
BUN: 12 mg/dL (ref 6–20)
CO2: 28 mmol/L (ref 22–32)
Calcium: 8 mg/dL — ABNORMAL LOW (ref 8.9–10.3)
Chloride: 100 mmol/L — ABNORMAL LOW (ref 101–111)
Creatinine, Ser: 0.78 mg/dL (ref 0.61–1.24)
Glucose, Bld: 118 mg/dL — ABNORMAL HIGH (ref 65–99)
POTASSIUM: 3.5 mmol/L (ref 3.5–5.1)
SODIUM: 136 mmol/L (ref 135–145)

## 2016-10-17 LAB — CBC
HCT: 37.6 % — ABNORMAL LOW (ref 39.0–52.0)
Hemoglobin: 12.5 g/dL — ABNORMAL LOW (ref 13.0–17.0)
MCH: 31.9 pg (ref 26.0–34.0)
MCHC: 33.2 g/dL (ref 30.0–36.0)
MCV: 95.9 fL (ref 78.0–100.0)
PLATELETS: 144 10*3/uL — AB (ref 150–400)
RBC: 3.92 MIL/uL — AB (ref 4.22–5.81)
RDW: 13.2 % (ref 11.5–15.5)
WBC: 9.1 10*3/uL (ref 4.0–10.5)

## 2016-10-17 NOTE — Evaluation (Signed)
Occupational Therapy Evaluation Patient Details Name: John KailBenjamin W Para MRN: [id number] DOB: 1965/07/29 Today's Date: 10/17/2016    History of Present Illness Pt is a 51 y.o. male s/p L THA anterior approach. PMHx: Arthritis, A flutter with RVR, GERD, HTN, Obesity.    Clinical Impression   Pt reports he was independent with ADL PTA. Currently pt overall min assist for functional mobility and mod-max assist for LB ADL. Pt planning to d/c home with 24/7 supervision from his girlfriend initially. Pt would benefit from continued skilled OT to address established goals.    Follow Up Recommendations  No OT follow up;Supervision/Assistance - 24 hour    Equipment Recommendations  3 in 1 bedside commode    Recommendations for Other Services PT consult     Precautions / Restrictions Precautions Precautions: Fall Restrictions Weight Bearing Restrictions: Yes LLE Weight Bearing: Weight bearing as tolerated      Mobility Bed Mobility Overal bed mobility: Needs Assistance Bed Mobility: Supine to Sit     Supine to sit: Min assist;HOB elevated     General bed mobility comments: Min assist for LLE to EOB, pt able to elevate trunk to sitting. HOB elevated with use of bed rails  Transfers Overall transfer level: Needs assistance Equipment used: Rolling walker (2 wheeled) Transfers: Sit to/from Stand Sit to Stand: Min assist;From elevated surface         General transfer comment: for balance. Bed elevated    Balance Overall balance assessment: Needs assistance Sitting-balance support: Feet supported;No upper extremity supported Sitting balance-Leahy Scale: Fair     Standing balance support: No upper extremity supported;During functional activity Standing balance-Leahy Scale: Fair                             ADL either performed or assessed with clinical judgement   ADL Overall ADL's : Needs assistance/impaired Eating/Feeding: Set up;Sitting   Grooming: Min  guard;Standing   Upper Body Bathing: Set up;Supervision/ safety;Sitting   Lower Body Bathing: Moderate assistance;Sit to/from stand   Upper Body Dressing : Set up;Supervision/safety;Sitting   Lower Body Dressing: Maximal assistance Lower Body Dressing Details (indicate cue type and reason): girlfriend donned socks Toilet Transfer: Minimal assistance;Ambulation;RW Toilet Transfer Details (indicate cue type and reason): pt able to stand at toilet to void. Educated on use of 3 in 1 over toilet to boost up         Functional mobility during ADLs: Minimal assistance;Rolling walker       Vision         Perception     Praxis      Pertinent Vitals/Pain Pain Assessment: Faces Faces Pain Scale: Hurts even more Pain Location: L hip with mobility Pain Descriptors / Indicators: Aching;Grimacing;Moaning Pain Intervention(s): Monitored during session;Repositioned     Hand Dominance     Extremity/Trunk Assessment Upper Extremity Assessment Upper Extremity Assessment: Overall WFL for tasks assessed   Lower Extremity Assessment Lower Extremity Assessment: Defer to PT evaluation   Cervical / Trunk Assessment Cervical / Trunk Assessment: Normal   Communication Communication Communication: No difficulties   Cognition Arousal/Alertness: Awake/alert Behavior During Therapy: WFL for tasks assessed/performed Overall Cognitive Status: Within Functional Limits for tasks assessed                                     General Comments       Exercises  Shoulder Instructions      Home Living Family/patient expects to be discharged to:: Private residence Living Arrangements: Alone Available Help at Discharge: Family;Available 24 hours/day (girlfriend planning to stay with pt for a few days) Type of Home: House Home Access: Stairs to enter [employer] of Steps: 5 Entrance Stairs-Rails: None Home Layout: One level     Bathroom Shower/Tub: Retail banker: Handicapped height     Home Equipment: None          Prior Functioning/Environment Level of Independence: Independent                 OT Problem List: Impaired balance (sitting and/or standing);Decreased activity tolerance;Decreased range of motion;Decreased knowledge of use of DME or AE;Obesity;Pain;Increased edema      OT Treatment/Interventions: Self-care/ADL training;Energy conservation;DME and/or AE instruction;Therapeutic activities;Balance training;Patient/family education    OT Goals(Current goals can be found in the care plan section) Acute Rehab OT Goals Patient Stated Goal: return home OT Goal Formulation: With patient Time For Goal Achievement: 10/31/16 Potential to Achieve Goals: Good ADL Goals Pt Will Perform Lower Body Bathing: with supervision;sit to/from stand (with or without AE) Pt Will Perform Lower Body Dressing: with supervision;sit to/from stand (with or without AE) Pt Will Transfer to Toilet: with supervision;ambulating;bedside commode (over toilet) Pt Will Perform Toileting - Clothing Manipulation and hygiene: with supervision;sit to/from stand Pt Will Perform Tub/Shower Transfer: Shower transfer;with supervision;ambulating;3 in 1;rolling walker  OT Frequency: Min 2X/week   Barriers to D/C: Inaccessible home environment  5 steps into house       Co-evaluation              AM-PAC PT "6 Clicks" Daily Activity     Outcome Measure Help from another person eating meals?: None Help from another person taking care of personal grooming?: A Little Help from another person toileting, which includes using toliet, bedpan, or urinal?: A Little Help from another person bathing (including washing, rinsing, drying)?: A Lot Help from another person to put on and taking off regular upper body clothing?: A Little Help from another person to put on and taking off regular lower body clothing?: A Lot 6 Click Score: 17   End of  Session Equipment Utilized During Treatment: Engineer, water Communication: Mobility status Manufacturing engineer)  Activity Tolerance: Patient tolerated treatment well Patient left: in chair;with call bell/phone within reach;with family/visitor present;with nursing/sitter in room  OT Visit Diagnosis: Other abnormalities of gait and mobility (R26.89);Pain Pain - Right/Left: Left Pain - part of body: Hip                Time: 1610-9604 OT Time Calculation (min): 26 min Charges:  OT General Charges $OT Visit: 1 Procedure OT Evaluation $OT Eval Moderate Complexity: 1 Procedure OT Treatments $Self Care/Home Management : 8-22 mins G-Codes:     Fredric Mare A. Brett Albino, M.S., OTR/L Pager: [phone]  Gaye Alken 10/17/2016, 10:03 AM

## 2016-10-17 NOTE — Op Note (Signed)
John Garcia, John Garcia             ACCOUNT NO.:  [id number]  MEDICAL RECORD NO.:  [id number]  LOCATION:                                 FACILITY:  PHYSICIAN:  John Panda. Magnus Ivan, M.D.DATE OF BIRTH:  [birth year]  DATE OF PROCEDURE:  10/16/2016 DATE OF DISCHARGE:                              OPERATIVE REPORT   PREOPERATIVE DIAGNOSES:  Primary osteoarthritis and degenerative joint disease, left hip.  POSTOPERATIVE DIAGNOSIS:  Primary osteoarthritis and degenerative joint disease, left hip.  PROCEDURE:  Left total hip arthroplasty through direct anterior approach.  IMPLANTS:  DePuy Sector Gription acetabular component size 60, 1 screw, size 36 +4 polyethylene liner, size 16 Corail femoral component with varus offset, size 36 +8.5 ceramic hip ball.  SURGEON:  John Panda. Magnus Ivan, M.D.  ASSISTANT:  Richardean Canal, PA-C.  ANESTHESIA:  Spinal.  ANTIBIOTICS:  2 to 3 g IV Ancef.  BLOOD LOSS:  200 mL.  COMPLICATIONS:  None.  INDICATIONS:  John Garcia is only a 51 year old gentleman, but he has had debilitating hip pain for some time now.  It has gotten worse, and he has x-rays that show significant femoral acetabular impingement.  He has loss of superolateral aspects of his left hip joint space and periarticular osteophytes.  He has tried and failed all forms of conservative treatment.  His pain is daily, and now it has detrimentally affected his activities of daily living, his mobility, and his quality of life.  At this point, he does wish to proceed with a total hip arthroplasty.  We talked about the risk of acute blood loss anemia, nerve and vessel injury, fracture, infection, dislocation, DVT.  He understands our goals are decreased pain, improved mobility, and overall improved quality of life.  PROCEDURE DESCRIPTION:  After informed consent was obtained, appropriate left hip was marked.  He was brought to the operating room, where he was sat up on a stretcher and a  spinal anesthesia was obtained.  He was then laid in a supine position, and traction boots were placed on both his feet.  Next, he was placed supine on the Hana fracture table.  The perineal post in place and both legs in inline skeletal traction devices, but no traction applied.  His left operative hip was prepped and draped with DuraPrep and sterile drapes.  Time-out was called, he was identified as correct patient, correct left hip.  I then made an incision just inferior and posterior to the anterior-superior iliac spine and carried this obliquely down the leg.  We dissected down tensor fascia lata muscle, and the tensor fascia was then divided longitudinally to proceed with a direct anterior approach to the hip. We identified and cauterized circumflex vessels and identified the hip capsule in an L-type format finding a very large joint effusion.  We found significant periarticular osteophytes, and then, we placed Cobra retractors along the medial and lateral femoral neck.  We made our femoral neck cut with an oscillating saw just proximal to the lesser trochanter and completed this on osteotome.  We placed a corkscrew guide in the femoral head, removed the femoral head in its entirety, and found it to be completely devoid of cartilage on the weightbearing  surface, it was hard as a rock as well.  We passed this off the back table and then, I cleaned remnants of acetabular labrum and found sclerotic changes in the superolateral acetabular rim as well.  I placed a bent Hohmann over the medial acetabular rim and then released the tissue distally.  I then began reaming under direct visualization from a size 42 reamer and then jumped increments all the way up to a size 60 with several steps along the way.  The last reamer was placed under direct fluoroscopy, so we could obtain our depth of reaming, our inclination, and anteversion. Once we were pleased with this, we placed the real DePuy  Sector Gription acetabular component size 60 and a single screw.  We then placed a 36 +4 neutral polyethylene liner for that size acetabular component. Attention was then turned to the femur with the leg externally rotated to 120 degrees, extended, and adducted.  We were able to place a Mueller retractor medially and a Hohmann retractor behind the greater trochanter.  We released the lateral joint capsule and used a box cutting osteotome in the inner femoral canal and the rongeur lateralized.  I then began broaching from a size 8 broach using the Corail broaching system.  We went all the way up to a size 16, and with the 16 in place, we trialed a varus offset femoral neck and 36 +5 hip ball based on our acetabular component placement and the lower neck cut. We reduced this into the acetabulum.  We were pleased with stability, but we felt like we needed just a little bit more offset and leg length. We dislocated the hip and removed the trial components.  We were able to place the real Corail femoral component size 16 with varus offset and the real 36 +8.5 ceramic hip ball.  We reduced this in the acetabulum. We were pleased with leg length, offset, stability, and range of motion. We then irrigated the soft tissue with normal saline solution using pulsatile lavage.  We closed the joint capsule with interrupted #1 Ethibond suture followed by #1 Vicryl, it was running in the tensor fascia, 0 Vicryl in the deep tissue, 2-0 Vicryl in the subcutaneous tissue, 4-0 Monocryl subcuticular stitch, and Steri-Strips on the skin. An Aquacel dressing was applied.  He was taken off the Hana table, and an in and out catheter was placed.  In and out catheterization of his bladder was performed.  He was then taken to recovery room in stable condition.  All final counts were correct.  There were no complications noted.  Of note, Richardean CanalGilbert Clark, PA-C, assisted in entire case and assistance was crucial from  incision to closure and patient positioning as well.     John Garcia, M.D.   ______________________________ John Garcia, M.D.    CYB/MEDQ  D:  10/16/2016  T:  10/16/2016  Job:  [id number]

## 2016-10-17 NOTE — Evaluation (Signed)
Physical Therapy Evaluation Patient Details Name: John Garcia MRN: [id number] DOB: Mar 02, 1966 Today's Date: 10/17/2016   History of Present Illness  Pt is a 51 y.o. male s/p L THA anterior approach. PMHx: Arthritis, A flutter with RVR, GERD, HTN, Obesity.   Clinical Impression  Patient doing well with initial eval, sitting up in chair upon arrival. Transfers and gait training initiated today with pt requiring Min A to [employer] for safety. Pt able to ambulate up to 75 feet with antalgic gait pattern with reduced weight acceptance onto L LE due to pain. Will benefit from continued PT to address functional mobility.     Follow Up Recommendations Home health PT    Equipment Recommendations  Rolling walker with 5" wheels (tall)    Recommendations for Other Services       Precautions / Restrictions Precautions Precautions: Fall Restrictions Weight Bearing Restrictions: Yes LLE Weight Bearing: Weight bearing as tolerated      Mobility  Bed Mobility Overal bed mobility: Needs Assistance Bed Mobility: Supine to Sit     Supine to sit: Min assist;HOB elevated     General bed mobility comments: Pt up in chair  Transfers Overall transfer level: Needs assistance Equipment used: Rolling walker (2 wheeled) Transfers: Sit to/from Stand Sit to Stand: Min assist         General transfer comment: for immediate balance; from recliner  Ambulation/Gait Ambulation/Gait assistance: Min guard Ambulation Distance (Feet): 75 Feet Assistive device: Rolling walker (2 wheeled) Gait Pattern/deviations: Step-to pattern;Decreased step length - right;Decreased stance time - left;Decreased weight shift to left;Antalgic;Trunk flexed     General Gait Details: Multiple standing rest breaks during ambulation due to pain. No LOB with Min guard for overall safety.   Stairs            Wheelchair Mobility    Modified Rankin (Stroke Patients Only)       Balance Overall balance  assessment: Needs assistance Sitting-balance support: Feet supported;No upper extremity supported Sitting balance-Leahy Scale: Fair     Standing balance support: During functional activity;Bilateral upper extremity supported Standing balance-Leahy Scale: Poor Standing balance comment: Pt relying on walker due to pain at L hip                             Pertinent Vitals/Pain Pain Assessment: 0-10 Pain Score: 6  Faces Pain Scale: Hurts even more Pain Location: L hip with mobility Pain Descriptors / Indicators: Aching;Grimacing;Moaning Pain Intervention(s): Monitored during session;Repositioned (multiple rest breaks)    Home Living Family/patient expects to be discharged to:: Private residence Living Arrangements: Alone Available Help at Discharge: Family;Available 24 hours/day (girlfriend planning to stay with pt for a few days) Type of Home: House Home Access: Stairs to enter Entrance Stairs-Rails: None Entrance Stairs-Number of Steps: 5 Home Layout: One level Home Equipment: None      Prior Function Level of Independence: Independent               Hand Dominance        Extremity/Trunk Assessment   Upper Extremity Assessment Upper Extremity Assessment: Defer to OT evaluation    Lower Extremity Assessment Lower Extremity Assessment: LLE deficits/detail;Generalized weakness LLE Deficits / Details: weakness of L hip s/p L THA LLE Sensation: decreased light touch (left proximal lateral thigh)    Cervical / Trunk Assessment Cervical / Trunk Assessment: Normal  Communication   Communication: No difficulties  Cognition Arousal/Alertness: Awake/alert Behavior During Therapy:  WFL for tasks assessed/performed Overall Cognitive Status: Within Functional Limits for tasks assessed                                        General Comments      Exercises Total Joint Exercises Ankle Circles/Pumps: Both;10 reps Quad Sets: Left;10  reps Gluteal Sets: Left;10 reps Heel Slides: AAROM;Left;10 reps;Seated Long Arc Quad: AAROM;Left;10 reps   Assessment/Plan    PT Assessment Patient needs continued PT services  PT Problem List Decreased strength;Decreased range of motion;Decreased activity tolerance;Decreased balance;Pain;Decreased mobility;Decreased knowledge of use of DME       PT Treatment Interventions DME instruction;Gait training;Stair training;Functional mobility training;Therapeutic activities;Therapeutic exercise;Balance training;Patient/family education    PT Goals (Current goals can be found in the Care Plan section)  Acute Rehab PT Goals Patient Stated Goal: return home PT Goal Formulation: With patient Time For Goal Achievement: 10/24/16 Potential to Achieve Goals: Good    Frequency 7X/week   Barriers to discharge        Co-evaluation               AM-PAC PT "6 Clicks" Daily Activity  Outcome Measure Difficulty turning over in bed (including adjusting bedclothes, sheets and blankets)?: Total Difficulty moving from lying on back to sitting on the side of the bed? : Total Difficulty sitting down on and standing up from a chair with arms (e.g., wheelchair, bedside commode, etc,.)?: Total Help needed moving to and from a bed to chair (including a wheelchair)?: A Little Help needed walking in hospital room?: A Little Help needed climbing 3-5 steps with a railing? : A Lot 6 Click Score: 11    End of Session Equipment Utilized During Treatment: Gait belt Activity Tolerance: Patient limited by pain Patient left: in chair;with call bell/phone within reach Nurse Communication: Mobility status;Weight bearing status PT Visit Diagnosis: Difficulty in walking, not elsewhere classified (R26.2);Other abnormalities of gait and mobility (R26.89);Muscle weakness (generalized) (M62.81);Unsteadiness on feet (R26.81)    Time: 1031-1101 PT Time Calculation (min) (ACUTE ONLY): 30 min   Charges:   PT  Evaluation $PT Eval Low Complexity: 1 Procedure PT Treatments $Gait Training: 8-22 mins   PT G Codes:        Kipp LaurenceStephanie R Aaron, PT, DPT 10/17/16 12:15 PM   Kipp LaurenceStephanie R Aaron 10/17/2016, 12:13 PM

## 2016-10-17 NOTE — Progress Notes (Signed)
Physical Therapy Treatment Patient Details Name: John Garcia MRN: [id number] DOB: 26-Apr-1966 Today's Date: 10/17/2016    History of Present Illness Pt is a 51 y.o. male s/p L THA direct anterior approach on 10/16/16. PMHx: Arthritis, A flutter with RVR, GERD, HTN, Obesity.     PT Comments    Pt is POD #1 and this is his second session.  He was only able to make it to the same spot he ambulated this AM and back to the chair.  He reports he has been practicing his exercises all day and we added some new ones this PM.  Ice applied and pt left in the recliner to eat supper at the end of our session. PT will continue to follow acutely.   Follow Up Recommendations  Home health PT     Equipment Recommendations  Rolling walker with 5" wheels (tall)    Recommendations for Other Services   NA     Precautions / Restrictions Precautions Precautions: Fall Restrictions Weight Bearing Restrictions: Yes LLE Weight Bearing: Weight bearing as tolerated    Mobility  Bed Mobility Overal bed mobility: Needs Assistance Bed Mobility: Supine to Sit     Supine to sit: Modified independent (Device/Increase time);HOB elevated     General bed mobility comments: Pt with heavy reliance on rail and HOB elevated to get EOB, but did so without help from PT  Transfers Overall transfer level: Needs assistance Equipment used: Rolling walker (2 wheeled) Transfers: Sit to/from Stand Sit to Stand: Min assist;From elevated surface         General transfer comment: Min assist for stability during transitions from elevated bed.  He is quite tall, so lower surfaces are difficult.  I put blankets in his chair to make it higher and easier to get out of later.   Ambulation/Gait Ambulation/Gait assistance: Min guard Ambulation Distance (Feet): 75 Feet Assistive device: Rolling walker (2 wheeled) Gait Pattern/deviations: Step-to pattern;Decreased step length - right;Decreased stance time - left;Decreased  weight shift to left;Antalgic;Trunk flexed Gait velocity: decreased Gait velocity interpretation: Below normal speed for age/gender General Gait Details: Pt with moderately antalgic gait pattern, multiple standing rest breaks due to pain, RW adjusted down due to long arm length and upper trap shrugging during gait.            Balance Overall balance assessment: Needs assistance Sitting-balance support: Feet supported;No upper extremity supported;Single extremity supported;Bilateral upper extremity supported Sitting balance-Leahy Scale: Fair Sitting balance - Comments: Pt tends to prop on one or both arms in sitting to unweight hip   Standing balance support: Bilateral upper extremity supported Standing balance-Leahy Scale: Poor Standing balance comment: Min guard assist and support from RW.                             Cognition Arousal/Alertness: Awake/alert Behavior During Therapy: WFL for tasks assessed/performed Overall Cognitive Status: Within Functional Limits for tasks assessed                                        Exercises Total Joint Exercises Ankle Circles/Pumps: Both;10 reps Quad Sets: Left;10 reps Short Arc Quad: AROM;Left;10 reps Heel Slides: AAROM;Left;10 reps;Seated Hip ABduction/ADduction: AAROM;Left;10 reps        Pertinent Vitals/Pain Pain Assessment: 0-10 Pain Score: 7  Pain Location: L hip with mobility Pain Descriptors / Indicators: Aching;Grimacing;Moaning  Pain Intervention(s): Limited activity within patient's tolerance;Monitored during session;Repositioned;Patient requesting pain meds-RN notified;Ice applied           PT Goals (current goals can now be found in the care plan section) Acute Rehab PT Goals Patient Stated Goal: return home    Frequency    7X/week      PT Plan Current plan remains appropriate       AM-PAC PT "6 Clicks" Daily Activity  Outcome Measure  Difficulty turning over in bed  (including adjusting bedclothes, sheets and blankets)?: Total Difficulty moving from lying on back to sitting on the side of the bed? : Total Difficulty sitting down on and standing up from a chair with arms (e.g., wheelchair, bedside commode, etc,.)?: Total Help needed moving to and from a bed to chair (including a wheelchair)?: A Little Help needed walking in hospital room?: A Little Help needed climbing 3-5 steps with a railing? : A Lot 6 Click Score: 11    End of Session Equipment Utilized During Treatment: Gait belt Activity Tolerance: Patient limited by pain Patient left: in chair;with call bell/phone within reach;with family/visitor present Nurse Communication: Mobility status;Patient requests pain meds PT Visit Diagnosis: Difficulty in walking, not elsewhere classified (R26.2);Other abnormalities of gait and mobility (R26.89);Muscle weakness (generalized) (M62.81);Unsteadiness on feet (R26.81);Pain Pain - Right/Left: Left Pain - part of body: Hip     Time: 4098-1191 PT Time Calculation (min) (ACUTE ONLY): 39 min  Charges:  $Gait Training: 8-22 mins $Therapeutic Exercise: 8-22 mins $Therapeutic Activity: 8-22 mins          John Garcia, PT, DPT [phone]            10/17/2016, 5:50 PM

## 2016-10-17 NOTE — Progress Notes (Signed)
Subjective: 1 Day Post-Op Procedure(s) (LRB): LEFT TOTAL HIP ARTHROPLASTY ANTERIOR APPROACH (Left) Patient reports pain as moderate.    Objective: Vital signs in last 24 hours: Temp:  [97.7 F (36.5 C)-99.9 F (37.7 C)] 99.1 F (37.3 C) (06/06 0539) Pulse Rate:  [55-101] 98 (06/06 0539) Resp:  [10-20] 18 (06/06 0539) BP: (91-157)/(50-99) 144/88 (06/06 0539) SpO2:  [94 %-100 %] 100 % (06/06 0539) Weight:  [278 lb (126.1 kg)] 278 lb (126.1 kg) (06/05 1209)  Intake/Output from previous day: 06/05 0701 - 06/06 0700 In: 2560 [P.O.:60; I.V.:2400; IV Piggyback:100] Out: 995 [Urine:795; Blood:200] Intake/Output this shift: Total I/O In: 1160 [P.O.:60; I.V.:1000; IV Piggyback:100] Out: 495 [Urine:495]   Recent Labs  10/17/16 0540  HGB 12.5*    Recent Labs  10/17/16 0540  WBC 9.1  RBC 3.92*  HCT 37.6*  PLT 144*    Recent Labs  10/17/16 0540  NA 136  K 3.5  CL 100*  CO2 28  BUN 12  CREATININE 0.78  GLUCOSE 118*  CALCIUM 8.0*   No results for input(s): LABPT, INR in the last 72 hours.  Sensation intact distally Intact pulses distally Dorsiflexion/Plantar flexion intact Incision: dressing C/D/I  Assessment/Plan: 1 Day Post-Op Procedure(s) (LRB): LEFT TOTAL HIP ARTHROPLASTY ANTERIOR APPROACH (Left) Up with therapy Plan for discharge tomorrow Discharge home with home health  Kathryne HitchChristopher Y Hoby Kawai 10/17/2016, 6:48 AM

## 2016-10-18 MED ORDER — OXYCODONE HCL 5 MG PO TABS: 5.0000 mg | ORAL_TABLET | ORAL | 0 refills | Status: DC | PRN

## 2016-10-18 MED ORDER — ASPIRIN 81 MG PO CHEW: 81.0000 mg | CHEWABLE_TABLET | Freq: Two times a day (BID) | ORAL | 0 refills | Status: DC

## 2016-10-18 NOTE — Progress Notes (Signed)
PT Cancellation Note  Patient Details Name: John Garcia MRN: [id number] DOB: 22-Jun-1965   Cancelled Treatment:    Reason Eval/Treat Not Completed: Other (comment) (Pt had worked with OT, in pain and refused. Return in pm per pt request.)   Berline LopesDawn F Meziah Blasingame 10/18/2016, 10:17 AM  Eber Jonesawn Mulan Adan,PT Acute Rehabilitation [phone] (pager)

## 2016-10-18 NOTE — Discharge Instructions (Signed)

## 2016-10-18 NOTE — Progress Notes (Signed)
Physical Therapy Treatment Patient Details Name: John Garcia MRN: [id number] DOB: 05-31-1965 Today's Date: 10/18/2016    History of Present Illness Pt is a 51 y.o. male s/p L THA direct anterior approach on 10/16/16. PMHx: Arthritis, A flutter with RVR, GERD, HTN, Obesity.     PT Comments    Pt admitted with above diagnosis. Pt currently with functional limitations due to the deficits listed below (see PT Problem List). Pt refused to ambulate or practice steps.  Took girlfriend to gym for education and gaver her a handout.  Verbal instruction to pt.  Will follow as pt able.  Pt will benefit from skilled PT to increase their independence and safety with mobility to allow discharge to the venue listed below.     Follow Up Recommendations  Home health PT     Equipment Recommendations  Rolling walker with 5" wheels (tall)    Recommendations for Other Services       Precautions / Restrictions Precautions Precautions: Fall Restrictions Weight Bearing Restrictions: Yes LLE Weight Bearing: Weight bearing as tolerated    Mobility  Bed Mobility               General bed mobility comments: Pt declined OOB.  Transfers                    Ambulation/Gait                 Stairs Stairs: Yes   Stair Management: Backwards;Step to pattern;With walker Number of Stairs: 4 General stair comments: Took girlfriend to steps to go over steps as pt refused to practice.    Wheelchair Mobility    Modified Rankin (Stroke Patients Only)       Balance                                            Cognition Arousal/Alertness: Awake/alert Behavior During Therapy: WFL for tasks assessed/performed Overall Cognitive Status: Within Functional Limits for tasks assessed                                        Exercises      General Comments General comments (skin integrity, edema, etc.): Pt refused to get OOB.  He had worked with OT  earlier but PT checked back x 2 and pt refused both times.  Took girlfriend to gym and provided demonstration of technique of up and down stairs with a walker.  Gave her a handout as well.  Verbally instructed pt in how to perform.  Also discussed car transfer with pt and girlfriend.       Pertinent Vitals/Pain Pain Assessment: Faces Faces Pain Scale: Hurts even more Pain Location: L hip Pain Descriptors / Indicators: Aching;Grimacing;Guarding;Operative site guarding Pain Intervention(s): Limited activity within patient's tolerance;Monitored during session;Premedicated before session;Repositioned    Home Living                      Prior Function            PT Goals (current goals can now be found in the care plan section) Progress towards PT goals: Progressing toward goals    Frequency    7X/week      PT Plan Current plan remains appropriate  Co-evaluation              AM-PAC PT "6 Clicks" Daily Activity  Outcome Measure  Difficulty turning over in bed (including adjusting bedclothes, sheets and blankets)?: Total Difficulty moving from lying on back to sitting on the side of the bed? : Total Difficulty sitting down on and standing up from a chair with arms (e.g., wheelchair, bedside commode, etc,.)?: Total Help needed moving to and from a bed to chair (including a wheelchair)?: A Little Help needed walking in hospital room?: A Little Help needed climbing 3-5 steps with a railing? : A Lot 6 Click Score: 11    End of Session   Activity Tolerance: Patient limited by pain;Patient limited by fatigue Patient left: with call bell/phone within reach;with family/visitor present;in bed Nurse Communication: Mobility status;Patient requests pain meds PT Visit Diagnosis: Difficulty in walking, not elsewhere classified (R26.2);Other abnormalities of gait and mobility (R26.89);Muscle weakness (generalized) (M62.81);Unsteadiness on feet (R26.81);Pain Pain -  Right/Left: Left Pain - part of body: Hip     Time: [id number] PT Time Calculation (min) (ACUTE ONLY): 10 min  Charges:  $Self Care/Home Management: 8-22                    G Codes:       John Garcia,PT Acute Rehabilitation [phone] (pager)    John Garcia 10/18/2016, 2:02 PM

## 2016-10-18 NOTE — Progress Notes (Signed)
Occupational Therapy Treatment Patient Details Name: John Garcia MRN: [id number] DOB: Jul 25, 1965 Today's Date: 10/18/2016    History of present illness Pt is a 51 y.o. male s/p L THA direct anterior approach on 10/16/16. PMHx: Arthritis, A flutter with RVR, GERD, HTN, Obesity.    OT comments  Pt able to perform transfers and functional mobility with min guard assist today, continues to require max assist for LB ADL due to pain and decreased ROM. Educated pt on walk in shower transfer technique and use of bench for safety during bathing. D/c plan remains appropriate. Will continue to follow acutely.   Follow Up Recommendations  No OT follow up;Supervision/Assistance - 24 hour    Equipment Recommendations  3 in 1 bedside commode    Recommendations for Other Services      Precautions / Restrictions Precautions Precautions: Fall Restrictions Weight Bearing Restrictions: Yes LLE Weight Bearing: Weight bearing as tolerated       Mobility Bed Mobility Overal bed mobility: Needs Assistance Bed Mobility: Supine to Sit     Supine to sit: Min guard;HOB elevated     General bed mobility comments: Min guard for safety with LLE. HOB elevated with heavy use of bed rail. Increased time and effort required  Transfers Overall transfer level: Needs assistance Equipment used: Rolling walker (2 wheeled) Transfers: Sit to/from Stand Sit to Stand: Min guard;From elevated surface         General transfer comment: Min guard for safety with sit to stand from elevated EOB. Good hand placement and technique    Balance Overall balance assessment: Needs assistance Sitting-balance support: Feet supported;No upper extremity supported;Single extremity supported Sitting balance-Leahy Scale: Fair     Standing balance support: No upper extremity supported;During functional activity Standing balance-Leahy Scale: Fair Standing balance comment: Pt able to remove hands from RW in static  standing for brief period of time                           ADL either performed or assessed with clinical judgement   ADL Overall ADL's : Needs assistance/impaired                     Lower Body Dressing: Maximal assistance;Sit to/from stand Lower Body Dressing Details (indicate cue type and reason): Encouraged particiaption in LB ADL once pain more managable, girlfriend to assist as needed until then. Toilet Transfer: Min guard;Ambulation;BSC;RW Toilet Transfer Details (indicate cue type and reason): Simulated by sit to stand from EOB with functional mobility in room     Tub/ Shower Transfer: Min guard;Walk-in shower;Ambulation;Shower Dealer Details (indicate cue type and reason): Educated on walk in shower transfer technique with use of RW and shower bench for safety. Functional mobility during ADLs: Min guard;Rolling walker       Vision       Perception     Praxis      Cognition Arousal/Alertness: Awake/alert Behavior During Therapy: WFL for tasks assessed/performed Overall Cognitive Status: Within Functional Limits for tasks assessed                                          Exercises     Shoulder Instructions       General Comments      Pertinent Vitals/ Pain       Pain Assessment: 0-10  Pain Score: 6  Pain Location: L hip Pain Descriptors / Indicators: Aching;Grimacing;Guarding;Operative site guarding Pain Intervention(s): Monitored during session;Repositioned;Ice applied  Home Living                                          Prior Functioning/Environment              Frequency  Min 2X/week        Progress Toward Goals  OT Goals(current goals can now be found in the care plan section)  Progress towards OT goals: Progressing toward goals  Acute Rehab OT Goals Patient Stated Goal: return home OT Goal Formulation: With patient  Plan Discharge plan remains  appropriate    Co-evaluation                 AM-PAC PT "6 Clicks" Daily Activity     Outcome Measure   Help from another person eating meals?: None Help from another person taking care of personal grooming?: A Little Help from another person toileting, which includes using toliet, bedpan, or urinal?: A Little Help from another person bathing (including washing, rinsing, drying)?: A Lot Help from another person to put on and taking off regular upper body clothing?: A Little Help from another person to put on and taking off regular lower body clothing?: A Lot 6 Click Score: 17    End of Session Equipment Utilized During Treatment: Rolling walker  OT Visit Diagnosis: Other abnormalities of gait and mobility (R26.89);Pain Pain - Right/Left: Left Pain - part of body: Hip   Activity Tolerance Patient tolerated treatment well   Patient Left in chair;with call bell/phone within reach;with family/visitor present   Nurse Communication Mobility status;Other (comment) (IV dripping)        Time: [id number] OT Time Calculation (min): 23 min  Charges: OT General Charges $OT Visit: 1 Procedure OT Treatments $Self Care/Home Management : 23-37 mins  John Garcia A. Brett Albinooffey, M.S., OTR/L Pager: [phone]   John Garcia A John Garcia 10/18/2016, 8:22 AM

## 2016-10-18 NOTE — Progress Notes (Signed)
Subjective: 2 Days Post-Op Procedure(s) (LRB): LEFT TOTAL HIP ARTHROPLASTY ANTERIOR APPROACH (Left) Patient reports pain as moderate.  Very slow progress with therapy.  Objective: Vital signs in last 24 hours: Temp:  [99.5 F (37.5 C)-99.8 F (37.7 C)] 99.5 F (37.5 C) (06/07 0635) Pulse Rate:  [101-104] 101 (06/07 0635) Resp:  [18] 18 (06/07 0635) BP: (139-141)/(88) 139/88 (06/07 0635) SpO2:  [96 %-97 %] 96 % (06/07 0635)  Intake/Output from previous day: 06/06 0701 - 06/07 0700 In: 868.8 [P.O.:160; I.V.:708.8] Out: 500 [Urine:500] Intake/Output this shift: Total I/O In: 240 [P.O.:240] Out: 300 [Urine:300]   Recent Labs  10/17/16 0540  HGB 12.5*    Recent Labs  10/17/16 0540  WBC 9.1  RBC 3.92*  HCT 37.6*  PLT 144*    Recent Labs  10/17/16 0540  NA 136  K 3.5  CL 100*  CO2 28  BUN 12  CREATININE 0.78  GLUCOSE 118*  CALCIUM 8.0*   No results for input(s): LABPT, INR in the last 72 hours.  Sensation intact distally Intact pulses distally Dorsiflexion/Plantar flexion intact Incision: scant drainage  Assessment/Plan: 2 Days Post-Op Procedure(s) (LRB): LEFT TOTAL HIP ARTHROPLASTY ANTERIOR APPROACH (Left) Up with therapy Plan for discharge tomorrow Discharge home with home health  Kathryne HitchChristopher Y Jamesmichael Shadd 10/18/2016, 12:35 PM

## 2016-10-19 MED ORDER — METHOCARBAMOL 500 MG PO TABS: 500.0000 mg | ORAL_TABLET | Freq: Four times a day (QID) | ORAL | 0 refills | Status: DC | PRN

## 2016-10-19 NOTE — Progress Notes (Signed)
Patient ID: John Garcia, male   DOB: 10/25/1965, 51 y.o.   MRN: [id number] Doing well overall.  Can be discharged to home today.

## 2016-10-19 NOTE — Discharge Summary (Signed)
Patient ID: John Garcia MRN: [id number] DOB/AGE: 50/14/1967 50 y.o.  Admit date: 10/16/2016 Discharge date: 10/19/2016  Admission Diagnoses:  Principal Problem:   Unilateral primary osteoarthritis, left hip Active Problems:   Status post total replacement of left hip   Discharge Diagnoses:  Same  Past Medical History:  Diagnosis Date  . Arthritis   . Atrial flutter with rapid ventricular response (HCC)   . GERD (gastroesophageal reflux disease)   . History of hiatal hernia   . Hyperlipidemia   . Hypertension   . Obesity     Surgeries: Procedure(s): LEFT TOTAL HIP ARTHROPLASTY ANTERIOR APPROACH on 10/16/2016   Consultants:   Discharged Condition: Improved  Hospital Course: John KailBenjamin W Brannum is an 51 y.o. male who was admitted 10/16/2016 for operative treatment ofUnilateral primary osteoarthritis, left hip. Patient has severe unremitting pain that affects sleep, daily activities, and work/hobbies. After pre-op clearance the patient was taken to the operating room on 10/16/2016 and underwent  Procedure(s): LEFT TOTAL HIP ARTHROPLASTY ANTERIOR APPROACH.    Patient was given perioperative antibiotics: Anti-infectives    Start     Dose/Rate Route Frequency Ordered Stop   10/16/16 2100  ceFAZolin (ANCEF) IVPB 2g/100 mL premix     2 g 200 mL/hr over 30 Minutes Intravenous Every 6 hours 10/16/16 1829 10/17/16 0343   10/16/16 1300  ceFAZolin (ANCEF) 3 g in dextrose 5 % 50 mL IVPB     3 g 130 mL/hr over 30 Minutes Intravenous To ShortStay Surgical 10/15/16 0747 10/16/16 1513   10/15/16 1300  ceFAZolin (ANCEF) IVPB 2g/100 mL premix  Status:  Discontinued     2 g 200 mL/hr over 30 Minutes Intravenous To ShortStay Surgical 10/15/16 0729 10/15/16 0747       Patient was given sequential compression devices, early ambulation, and chemoprophylaxis to prevent DVT.  Patient benefited maximally from hospital stay and there were no complications.    Recent vital signs: Patient Vitals  for the past 24 hrs:  BP Temp Temp src Pulse Resp SpO2  10/18/16 2135 120/70 98.7 F (37.1 C) Axillary 94 16 97 %  10/18/16 1735 - 100.3 F (37.9 C) Oral - - -  10/18/16 1500 (!) 145/72 (!) 101 F (38.3 C) Oral 90 16 99 %     Recent laboratory studies:  Recent Labs  10/17/16 0540  WBC 9.1  HGB 12.5*  HCT 37.6*  PLT 144*  NA 136  K 3.5  CL 100*  CO2 28  BUN 12  CREATININE 0.78  GLUCOSE 118*  CALCIUM 8.0*     Discharge Medications:   Allergies as of 10/19/2016      Reactions   Bee Venom Anaphylaxis   Ivp Dye [iodinated Diagnostic Agents] Hives, Shortness Of Breath   Shellfish Allergy Rash   Throat itches Takes Benadryl and can eat shrimp       Medication List    STOP taking these medications   GLUCOSAMINE PO   HYDROcodone-acetaminophen 10-325 MG tablet Commonly known as:  NORCO   ibuprofen 200 MG tablet Commonly known as:  ADVIL,MOTRIN   naproxen sodium 220 MG tablet Commonly known as:  ANAPROX     TAKE these medications   aspirin 81 MG chewable tablet Chew 1 tablet (81 mg total) by mouth 2 (two) times daily after a meal. What changed:  when to take this   atorvastatin 20 MG tablet Commonly known as:  LIPITOR Take 1 tablet (20 mg total) by mouth daily.   cetirizine 10 MG  tablet Commonly known as:  ZYRTEC Take 10 mg by mouth daily as needed for allergies.   losartan 100 MG tablet Commonly known as:  COZAAR Take 1 tablet (100 mg total) by mouth daily.   meloxicam 15 MG tablet Commonly known as:  MOBIC TAKE 1 TABLET (15 MG TOTAL) BY MOUTH DAILY. What changed:  how much to take  how to take this  when to take this  additional instructions   methocarbamol 500 MG tablet Commonly known as:  ROBAXIN Take 1 tablet (500 mg total) by mouth every 6 (six) hours as needed for muscle spasms.   multivitamin with minerals Tabs tablet Take 1 tablet by mouth daily.   oxyCODONE 5 MG immediate release tablet Commonly known as:  Oxy  IR/ROXICODONE Take 1-2 tablets (5-10 mg total) by mouth every 4 (four) hours as needed for breakthrough pain.   ZANTAC PO Take 1 tablet by mouth daily as needed (heartburn).            Durable Medical Equipment        Start     Ordered   10/16/16 1830  DME Walker rolling  Once    Question:  Patient needs a walker to treat with the following condition  Answer:  Status post total replacement of left hip   10/16/16 1829   10/16/16 1830  DME 3 n 1  Once     10/16/16 1829      Diagnostic Studies: Dg Pelvis Portable  Result Date: 10/16/2016 CLINICAL DATA:  Postop left total hip arthroplasty EXAM: PORTABLE PELVIS 1-2 VIEWS COMPARISON:  None. FINDINGS: Left total hip arthroplasty is anatomically aligned. No breakage or loosening of the hardware. IMPRESSION: Left total hip arthroplasty anatomically aligned. Electronically Signed   By: Jolaine Click M.D.   On: 10/16/2016 17:59   Dg C-arm 1-60 Min  Result Date: 10/16/2016 CLINICAL DATA:  51 year old male for hip replacement. Initial encounter. EXAM: DG C-ARM 61-120 MIN; OPERATIVE LEFT HIP WITH PELVIS COMPARISON:  01/27/2016. FINDINGS: Three intraoperative C-arm views submitted for review after procedure. Post total left hip replacement which appears in satisfactory position on this single projection. IMPRESSION: Post total left hip replacement which appears in satisfactory position on this single projection. Electronically Signed   By: Lacy Duverney M.D.   On: 10/16/2016 16:47   Dg Hip Operative Unilat W Or W/o Pelvis Left  Result Date: 10/16/2016 CLINICAL DATA:  51 year old male for hip replacement. Initial encounter. EXAM: DG C-ARM 61-120 MIN; OPERATIVE LEFT HIP WITH PELVIS COMPARISON:  01/27/2016. FINDINGS: Three intraoperative C-arm views submitted for review after procedure. Post total left hip replacement which appears in satisfactory position on this single projection. IMPRESSION: Post total left hip replacement which appears in  satisfactory position on this single projection. Electronically Signed   By: Lacy Duverney M.D.   On: 10/16/2016 16:47    Disposition: to home  Discharge Instructions    Discharge patient    Complete by:  As directed    Discharge disposition:  01-Home or Self Care   Discharge patient date:  10/19/2016      Follow-up Information    Kathryne Hitch, MD Follow up in 2 week(s).   Specialty:  Orthopedic Surgery Contact information: [address] [place] [address] [phone]            Signed: Kathryne Hitch 10/19/2016, 6:55 AM

## 2016-10-19 NOTE — Progress Notes (Signed)
Pt discharged after discharge and prescriptions given. All questions answered. IV discontinued. No s/s of distress at this time.

## 2016-10-19 NOTE — Progress Notes (Addendum)
PT Cancellation Note  Patient Details Name: John KailBenjamin W Heier MRN: [id number] DOB: 06/19/1965   Cancelled Treatment:    Reason Eval/Treat Not Completed: Other (comment) (Pt refused due to going home. Walked unit with girlfriend last night without difficulty. Offered to review exercises and they stated that they understand them and have been doing them.)   Berline LopesDawn F Elizabeth Haff 10/19/2016, 9:24 AM Eber Jonesawn Sira Adsit,PT Acute Rehabilitation [phone] (pager)

## 2016-10-20 DIAGNOSIS — E669 Obesity, unspecified: Secondary | ICD-10-CM | POA: Diagnosis not present

## 2016-10-20 DIAGNOSIS — E785 Hyperlipidemia, unspecified: Secondary | ICD-10-CM | POA: Diagnosis not present

## 2016-10-20 DIAGNOSIS — Z96642 Presence of left artificial hip joint: Secondary | ICD-10-CM | POA: Diagnosis not present

## 2016-10-20 DIAGNOSIS — Z79891 Long term (current) use of opiate analgesic: Secondary | ICD-10-CM | POA: Diagnosis not present

## 2016-10-20 DIAGNOSIS — Z6833 Body mass index (BMI) 33.0-33.9, adult: Secondary | ICD-10-CM | POA: Diagnosis not present

## 2016-10-20 DIAGNOSIS — I10 Essential (primary) hypertension: Secondary | ICD-10-CM | POA: Diagnosis not present

## 2016-10-20 DIAGNOSIS — Z471 Aftercare following joint replacement surgery: Secondary | ICD-10-CM | POA: Diagnosis not present

## 2016-10-20 DIAGNOSIS — Z87891 Personal history of nicotine dependence: Secondary | ICD-10-CM | POA: Diagnosis not present

## 2016-10-20 DIAGNOSIS — Z7982 Long term (current) use of aspirin: Secondary | ICD-10-CM | POA: Diagnosis not present

## 2016-10-23 DIAGNOSIS — Z7982 Long term (current) use of aspirin: Secondary | ICD-10-CM | POA: Diagnosis not present

## 2016-10-23 DIAGNOSIS — E785 Hyperlipidemia, unspecified: Secondary | ICD-10-CM | POA: Diagnosis not present

## 2016-10-23 DIAGNOSIS — Z79891 Long term (current) use of opiate analgesic: Secondary | ICD-10-CM | POA: Diagnosis not present

## 2016-10-23 DIAGNOSIS — I10 Essential (primary) hypertension: Secondary | ICD-10-CM | POA: Diagnosis not present

## 2016-10-23 DIAGNOSIS — Z87891 Personal history of nicotine dependence: Secondary | ICD-10-CM | POA: Diagnosis not present

## 2016-10-23 DIAGNOSIS — Z96642 Presence of left artificial hip joint: Secondary | ICD-10-CM | POA: Diagnosis not present

## 2016-10-23 DIAGNOSIS — E669 Obesity, unspecified: Secondary | ICD-10-CM | POA: Diagnosis not present

## 2016-10-23 DIAGNOSIS — Z6833 Body mass index (BMI) 33.0-33.9, adult: Secondary | ICD-10-CM | POA: Diagnosis not present

## 2016-10-23 DIAGNOSIS — Z471 Aftercare following joint replacement surgery: Secondary | ICD-10-CM | POA: Diagnosis not present

## 2016-10-26 DIAGNOSIS — Z6833 Body mass index (BMI) 33.0-33.9, adult: Secondary | ICD-10-CM | POA: Diagnosis not present

## 2016-10-26 DIAGNOSIS — I10 Essential (primary) hypertension: Secondary | ICD-10-CM | POA: Diagnosis not present

## 2016-10-26 DIAGNOSIS — Z79891 Long term (current) use of opiate analgesic: Secondary | ICD-10-CM | POA: Diagnosis not present

## 2016-10-26 DIAGNOSIS — E785 Hyperlipidemia, unspecified: Secondary | ICD-10-CM | POA: Diagnosis not present

## 2016-10-26 DIAGNOSIS — Z96642 Presence of left artificial hip joint: Secondary | ICD-10-CM | POA: Diagnosis not present

## 2016-10-26 DIAGNOSIS — Z87891 Personal history of nicotine dependence: Secondary | ICD-10-CM | POA: Diagnosis not present

## 2016-10-26 DIAGNOSIS — E669 Obesity, unspecified: Secondary | ICD-10-CM | POA: Diagnosis not present

## 2016-10-26 DIAGNOSIS — Z7982 Long term (current) use of aspirin: Secondary | ICD-10-CM | POA: Diagnosis not present

## 2016-10-26 DIAGNOSIS — Z471 Aftercare following joint replacement surgery: Secondary | ICD-10-CM | POA: Diagnosis not present

## 2016-10-30 ENCOUNTER — Ambulatory Visit (INDEPENDENT_AMBULATORY_CARE_PROVIDER_SITE_OTHER): Payer: BLUE CROSS/BLUE SHIELD | Admitting: Orthopaedic Surgery

## 2016-10-30 ENCOUNTER — Encounter (INDEPENDENT_AMBULATORY_CARE_PROVIDER_SITE_OTHER): Payer: Self-pay | Admitting: Orthopaedic Surgery

## 2016-10-30 VITALS — Ht 76.0 in | Wt 278.0 lb

## 2016-10-30 DIAGNOSIS — Z6833 Body mass index (BMI) 33.0-33.9, adult: Secondary | ICD-10-CM | POA: Diagnosis not present

## 2016-10-30 DIAGNOSIS — E785 Hyperlipidemia, unspecified: Secondary | ICD-10-CM | POA: Diagnosis not present

## 2016-10-30 DIAGNOSIS — Z471 Aftercare following joint replacement surgery: Secondary | ICD-10-CM | POA: Diagnosis not present

## 2016-10-30 DIAGNOSIS — Z96642 Presence of left artificial hip joint: Secondary | ICD-10-CM

## 2016-10-30 DIAGNOSIS — E669 Obesity, unspecified: Secondary | ICD-10-CM | POA: Diagnosis not present

## 2016-10-30 DIAGNOSIS — Z87891 Personal history of nicotine dependence: Secondary | ICD-10-CM | POA: Diagnosis not present

## 2016-10-30 DIAGNOSIS — Z79891 Long term (current) use of opiate analgesic: Secondary | ICD-10-CM | POA: Diagnosis not present

## 2016-10-30 DIAGNOSIS — Z7982 Long term (current) use of aspirin: Secondary | ICD-10-CM | POA: Diagnosis not present

## 2016-10-30 DIAGNOSIS — I10 Essential (primary) hypertension: Secondary | ICD-10-CM | POA: Diagnosis not present

## 2016-10-30 NOTE — Progress Notes (Signed)
The patient is 2 weeks status post a left total hip arthroplasty. Exam of lidocaine and well. He is traveling to [place] on July 4.  On examination his incision looks great. There is a moderate seroma and I drained about 80 mL of serous a was fluid around his hip. His notes infection all. His leg lengths are equal.  A continued increase his activities and we'll see him back in 6 weeks to see how is doing overall but no x-rays are needed. All questions were encouraged and answered. His go back to just as once a day baby aspirin and he does once take meloxicam again which is fine with

## 2016-11-02 DIAGNOSIS — E669 Obesity, unspecified: Secondary | ICD-10-CM | POA: Diagnosis not present

## 2016-11-02 DIAGNOSIS — Z87891 Personal history of nicotine dependence: Secondary | ICD-10-CM | POA: Diagnosis not present

## 2016-11-02 DIAGNOSIS — Z6833 Body mass index (BMI) 33.0-33.9, adult: Secondary | ICD-10-CM | POA: Diagnosis not present

## 2016-11-02 DIAGNOSIS — Z79891 Long term (current) use of opiate analgesic: Secondary | ICD-10-CM | POA: Diagnosis not present

## 2016-11-02 DIAGNOSIS — Z471 Aftercare following joint replacement surgery: Secondary | ICD-10-CM | POA: Diagnosis not present

## 2016-11-02 DIAGNOSIS — E785 Hyperlipidemia, unspecified: Secondary | ICD-10-CM | POA: Diagnosis not present

## 2016-11-02 DIAGNOSIS — Z96642 Presence of left artificial hip joint: Secondary | ICD-10-CM | POA: Diagnosis not present

## 2016-11-02 DIAGNOSIS — Z7982 Long term (current) use of aspirin: Secondary | ICD-10-CM | POA: Diagnosis not present

## 2016-11-02 DIAGNOSIS — I10 Essential (primary) hypertension: Secondary | ICD-10-CM | POA: Diagnosis not present

## 2016-11-07 ENCOUNTER — Ambulatory Visit (INDEPENDENT_AMBULATORY_CARE_PROVIDER_SITE_OTHER): Payer: BLUE CROSS/BLUE SHIELD | Admitting: Orthopaedic Surgery

## 2016-11-07 ENCOUNTER — Encounter (INDEPENDENT_AMBULATORY_CARE_PROVIDER_SITE_OTHER): Payer: Self-pay | Admitting: Orthopaedic Surgery

## 2016-11-07 VITALS — Ht 76.0 in | Wt 278.0 lb

## 2016-11-07 DIAGNOSIS — Z96642 Presence of left artificial hip joint: Secondary | ICD-10-CM

## 2016-11-07 NOTE — Progress Notes (Signed)
The patient is worked in today due to a postoperative seroma at his left total hip. He will have a drain today.  On examination there is noted there is no evidence of infection at all. There is a seroma and I drained 80 mL of fluid off the hip area and placed a Band-Aid after this. He felt great afterwards. He'll keep his normal follow-up appointment in several weeks and no x-rays are needed. Obviously if he develops from again he knows to call us and we can drain it again.

## 2016-11-14 ENCOUNTER — Other Ambulatory Visit: Payer: Self-pay | Admitting: Internal Medicine

## 2016-11-21 ENCOUNTER — Other Ambulatory Visit (INDEPENDENT_AMBULATORY_CARE_PROVIDER_SITE_OTHER): Payer: Self-pay | Admitting: Orthopaedic Surgery

## 2016-11-21 NOTE — Telephone Encounter (Signed)
Please advise 

## 2016-12-10 ENCOUNTER — Ambulatory Visit (INDEPENDENT_AMBULATORY_CARE_PROVIDER_SITE_OTHER): Payer: BLUE CROSS/BLUE SHIELD | Admitting: Orthopaedic Surgery

## 2016-12-11 ENCOUNTER — Ambulatory Visit (INDEPENDENT_AMBULATORY_CARE_PROVIDER_SITE_OTHER): Payer: BLUE CROSS/BLUE SHIELD | Admitting: Orthopaedic Surgery

## 2016-12-25 ENCOUNTER — Other Ambulatory Visit (INDEPENDENT_AMBULATORY_CARE_PROVIDER_SITE_OTHER): Payer: Self-pay | Admitting: Orthopaedic Surgery

## 2016-12-25 NOTE — Telephone Encounter (Signed)
Continue

## 2016-12-25 NOTE — Telephone Encounter (Signed)
Please advise 

## 2016-12-26 ENCOUNTER — Ambulatory Visit (INDEPENDENT_AMBULATORY_CARE_PROVIDER_SITE_OTHER): Payer: BLUE CROSS/BLUE SHIELD | Admitting: Physician Assistant

## 2016-12-26 DIAGNOSIS — Z96642 Presence of left artificial hip joint: Secondary | ICD-10-CM

## 2016-12-26 NOTE — Progress Notes (Signed)
[name] returns today sending one-day status post left total hip arthroplasty. He overall is doing well. Still has some soreness particularly laying on the left hip. He has no anterior cruciate ligament deficient left knee and some pain in the knee. He does note flexes the hip on standing he has some sensation of the giving way or loose feeling is unable to tell if it's in the knee or in the hip.  Physical exam: Left hip excellent internal rotation and external rotation without pain. Has slight tenderness of the left trochanteric region. Left calf supple nontender.  Impression : Status post left total hip arthroplasty 10/16/2016  Plan: He'll continue to work on range of motion strengthening. I did show him some IT band stretching exercises. We'll see him back in a year postop AP pelvis and lateral view of left hip at that time. He can always follow up sooner if there is any questions or concerns

## 2017-02-17 ENCOUNTER — Other Ambulatory Visit: Payer: Self-pay | Admitting: Internal Medicine

## 2017-02-17 NOTE — Telephone Encounter (Signed)
CPE due in March. Ok to refill until then but call him for appt.

## 2017-02-26 ENCOUNTER — Other Ambulatory Visit (INDEPENDENT_AMBULATORY_CARE_PROVIDER_SITE_OTHER): Payer: Self-pay | Admitting: Orthopaedic Surgery

## 2017-02-26 NOTE — Telephone Encounter (Signed)
Advise

## 2017-03-08 ENCOUNTER — Other Ambulatory Visit: Payer: Self-pay | Admitting: Internal Medicine

## 2017-07-10 DIAGNOSIS — J209 Acute bronchitis, unspecified: Secondary | ICD-10-CM | POA: Diagnosis not present

## 2017-07-10 DIAGNOSIS — R05 Cough: Secondary | ICD-10-CM | POA: Diagnosis not present

## 2017-07-25 DIAGNOSIS — B029 Zoster without complications: Secondary | ICD-10-CM | POA: Diagnosis not present

## 2017-08-02 ENCOUNTER — Other Ambulatory Visit: Payer: Self-pay | Admitting: Internal Medicine

## 2017-08-06 ENCOUNTER — Other Ambulatory Visit: Payer: Self-pay | Admitting: Internal Medicine

## 2017-08-06 DIAGNOSIS — Z0000 Encounter for general adult medical examination without abnormal findings: Secondary | ICD-10-CM

## 2017-08-13 ENCOUNTER — Ambulatory Visit: Payer: BLUE CROSS/BLUE SHIELD | Admitting: Internal Medicine

## 2017-08-13 ENCOUNTER — Encounter: Payer: Self-pay | Admitting: Internal Medicine

## 2017-08-13 VITALS — BP 110/78 | HR 78 | Ht 76.0 in | Wt 291.0 lb

## 2017-08-13 DIAGNOSIS — E7849 Other hyperlipidemia: Secondary | ICD-10-CM | POA: Diagnosis not present

## 2017-08-13 DIAGNOSIS — M21611 Bunion of right foot: Secondary | ICD-10-CM

## 2017-08-13 DIAGNOSIS — I10 Essential (primary) hypertension: Secondary | ICD-10-CM

## 2017-08-13 DIAGNOSIS — Z6835 Body mass index (BMI) 35.0-35.9, adult: Secondary | ICD-10-CM

## 2017-08-13 DIAGNOSIS — M21612 Bunion of left foot: Secondary | ICD-10-CM

## 2017-08-13 DIAGNOSIS — Z0000 Encounter for general adult medical examination without abnormal findings: Secondary | ICD-10-CM

## 2017-08-13 LAB — POCT URINALYSIS DIPSTICK
APPEARANCE: NORMAL
Bilirubin, UA: NEGATIVE
Blood, UA: NEGATIVE
GLUCOSE UA: NEGATIVE
Ketones, UA: NEGATIVE
LEUKOCYTES UA: NEGATIVE
NITRITE UA: NEGATIVE
ODOR: NORMAL
PROTEIN UA: NEGATIVE
SPEC GRAV UA: 1.015 (ref 1.010–1.025)
Urobilinogen, UA: 0.2 E.U./dL
pH, UA: 6 (ref 5.0–8.0)

## 2017-08-13 LAB — CBC WITH DIFFERENTIAL/PLATELET
BASOS PCT: 0.4 %
Basophils Absolute: 30 cells/uL (ref 0–200)
Eosinophils Absolute: 90 cells/uL (ref 15–500)
Eosinophils Relative: 1.2 %
HEMATOCRIT: 42.8 % (ref 38.5–50.0)
Hemoglobin: 15.2 g/dL (ref 13.2–17.1)
LYMPHS ABS: 2213 {cells}/uL (ref 850–3900)
MCH: 32.7 pg (ref 27.0–33.0)
MCHC: 35.5 g/dL (ref 32.0–36.0)
MCV: 92 fL (ref 80.0–100.0)
MPV: 10.9 fL (ref 7.5–12.5)
Monocytes Relative: 9.1 %
NEUTROS ABS: 4485 {cells}/uL (ref 1500–7800)
Neutrophils Relative %: 59.8 %
Platelets: 198 10*3/uL (ref 140–400)
RBC: 4.65 10*6/uL (ref 4.20–5.80)
RDW: 12.4 % (ref 11.0–15.0)
Total Lymphocyte: 29.5 %
WBC: 7.5 10*3/uL (ref 3.8–10.8)
WBCMIX: 683 {cells}/uL (ref 200–950)

## 2017-08-13 LAB — LIPID PANEL
CHOL/HDL RATIO: 3.8 (calc) (ref <5.0)
Cholesterol: 180 mg/dL (ref <200)
HDL: 47 mg/dL (ref >40)
LDL CHOLESTEROL (CALC): 107 mg/dL — AB
Non-HDL Cholesterol (Calc): 133 mg/dL (calc) — ABNORMAL HIGH (ref <130)
Triglycerides: 146 mg/dL (ref <150)

## 2017-08-13 LAB — COMPLETE METABOLIC PANEL WITH GFR
AG Ratio: 1.6 (calc) (ref 1.0–2.5)
ALKALINE PHOSPHATASE (APISO): 96 U/L (ref 40–115)
ALT: 21 U/L (ref 9–46)
AST: 14 U/L (ref 10–35)
Albumin: 4.2 g/dL (ref 3.6–5.1)
BUN: 17 mg/dL (ref 7–25)
CO2: 24 mmol/L (ref 20–32)
CREATININE: 0.77 mg/dL (ref 0.70–1.33)
Calcium: 9.1 mg/dL (ref 8.6–10.3)
Chloride: 103 mmol/L (ref 98–110)
GFR, Est African American: 123 mL/min/{1.73_m2} (ref >=60)
GFR, Est Non African American: 106 mL/min/{1.73_m2} (ref >=60)
GLUCOSE: 87 mg/dL (ref 65–99)
Globulin: 2.7 g/dL (calc) (ref 1.9–3.7)
Potassium: 4.3 mmol/L (ref 3.5–5.3)
Sodium: 142 mmol/L (ref 135–146)
Total Bilirubin: 0.5 mg/dL (ref 0.2–1.2)
Total Protein: 6.9 g/dL (ref 6.1–8.1)

## 2017-08-13 LAB — PSA: PSA: 0.2 ng/mL (ref <=4.0)

## 2017-08-13 NOTE — Progress Notes (Signed)
Subjective:    Patient ID: John Garcia, male    DOB: [birth year], 52 y.o.   MRN: [id number]  HPI 52 year old male in today for health maintenance exam and evaluation of medical issues.  Spoke with him today about screening colonoscopy.  He will consider it.  History of hypertension and hyperlipidemia.  He has a history of atrial fibrillation with rapid ventricular response in 2006.  He was hospitalized and treated with IV Cardizem and subsequently converted to sinus rhythm.  He had a cardiac cath that showed no significant coronary artery disease.  He is never had a further episode and is not on any antiarrhythmic medication.  Both parents are overweight.  In 2014 he weighed 258 pounds, in 2016 he weighed 259 pounds.  Last year he weighed 272 pounds and now he weighs 291 pounds.  He says it hurts to walk.  His feet bother him.  He does not always watch his diet.  He is allergic to bee stings.  History of left lower lobe pneumonia 2008.  Left hip arthroplasty by Dr. Magnus Ivan in June 2018.  Social history: He is divorced.  2 daughters and 2 grandchildren.  Youngest daughter resides with him and is homeschooled.  She enjoys barrel racing and roping.  Oldest daughter is married.  He has 2 grandchildren.  Ex-wife remarried.  He does not smoke.  Social alcohol consumption.  He is a Leisure centre manager.  Family history: Mother with history of diabetes, hypertension hyperlipidemia and obesity.  Father with history of coronary artery disease, melanoma, hypertension, morbid obesity, sleep apnea, lumbar spinal stenosis.  One sister with mild hypertension and migraine headaches.    Review of Systems bilateral bunions bothering him.  He should think about seeing podiatrist for repair     Objective:   Physical Exam  Constitutional: He is oriented to person, place, and time. He appears well-developed and well-nourished. No distress.  HENT:  Head: Normocephalic and atraumatic.  Right Ear: External ear  normal.  Left Ear: External ear normal.  Mouth/Throat: Oropharynx is clear and moist.  Eyes: Pupils are equal, round, and reactive to light. Conjunctivae and EOM are normal. Right eye exhibits no discharge. Left eye exhibits no discharge. No scleral icterus.  Neck: Neck supple. No JVD present. No thyromegaly present.  Cardiovascular: Normal rate, normal heart sounds and intact distal pulses.  No murmur heard. Pulmonary/Chest: Effort normal and breath sounds normal. No respiratory distress. He has no wheezes. He has no rales.  Abdominal: Soft. Bowel sounds are normal. He exhibits no distension and no mass. There is no tenderness. There is no rebound and no guarding.  Genitourinary:  Genitourinary Comments: Prostate normal  Musculoskeletal: He exhibits no edema.  Bilateral bunions  Lymphadenopathy:    He has no cervical adenopathy.  Neurological: He is alert and oriented to person, place, and time. He has normal reflexes. No cranial nerve deficit. Coordination normal.  Skin: Skin is warm and dry. No rash noted. He is not diaphoretic.  Psychiatric: He has a normal mood and affect. His behavior is normal. Judgment and thought content normal.  Vitals reviewed.         Assessment & Plan:  Bilateral bunions-should consider consult with podiatrist  Status post left arthroplasty by Dr. Magnus Ivan and doing well from that  Continued weight gain.  Needs to get serious about diet and exercise  Musculoskeletal pain treated with Mobic  Hyperlipidemia treated with Lipitor  History of atrial fibrillation with RVR single one-time occurrence and has  never recurred  Essential hypertension-stable on current regimen  Plan: He may return in 1 year or as needed.  I am concerned about his continued weight gain

## 2017-08-13 NOTE — Patient Instructions (Addendum)
Discussed diet and exercise. Labs drawn and pending. Colonoscopy discussed.  Continue same medications

## 2017-09-17 ENCOUNTER — Ambulatory Visit: Payer: BLUE CROSS/BLUE SHIELD | Admitting: Internal Medicine

## 2017-09-17 ENCOUNTER — Encounter: Payer: Self-pay | Admitting: Internal Medicine

## 2017-09-17 VITALS — BP 138/90 | HR 71 | Temp 98.3°F | Ht 76.0 in | Wt 291.0 lb

## 2017-09-17 DIAGNOSIS — B009 Herpesviral infection, unspecified: Secondary | ICD-10-CM | POA: Diagnosis not present

## 2017-09-17 DIAGNOSIS — R5383 Other fatigue: Secondary | ICD-10-CM

## 2017-09-17 DIAGNOSIS — L03317 Cellulitis of buttock: Secondary | ICD-10-CM

## 2017-09-17 LAB — CBC WITH DIFFERENTIAL/PLATELET
Basophils Absolute: 43 cells/uL (ref 0–200)
Basophils Relative: 0.5 %
EOS PCT: 1.5 %
Eosinophils Absolute: 129 cells/uL (ref 15–500)
HEMATOCRIT: 42.9 % (ref 38.5–50.0)
Hemoglobin: 14.8 g/dL (ref 13.2–17.1)
LYMPHS ABS: 1866 {cells}/uL (ref 850–3900)
MCH: 32.1 pg (ref 27.0–33.0)
MCHC: 34.5 g/dL (ref 32.0–36.0)
MCV: 93.1 fL (ref 80.0–100.0)
MPV: 10.5 fL (ref 7.5–12.5)
Monocytes Relative: 11.6 %
NEUTROS ABS: 5564 {cells}/uL (ref 1500–7800)
NEUTROS PCT: 64.7 %
Platelets: 185 10*3/uL (ref 140–400)
RBC: 4.61 10*6/uL (ref 4.20–5.80)
RDW: 11.9 % (ref 11.0–15.0)
Total Lymphocyte: 21.7 %
WBC mixed population: 998 cells/uL — ABNORMAL HIGH (ref 200–950)
WBC: 8.6 10*3/uL (ref 3.8–10.8)

## 2017-09-17 MED ORDER — DOXYCYCLINE HYCLATE 100 MG PO TABS: 100.0000 mg | ORAL_TABLET | Freq: Two times a day (BID) | ORAL | 0 refills | Status: DC

## 2017-09-17 MED ORDER — VALACYCLOVIR HCL 500 MG PO TABS: 500.0000 mg | ORAL_TABLET | Freq: Two times a day (BID) | ORAL | 3 refills | Status: AC

## 2017-09-17 NOTE — Patient Instructions (Signed)
Take Valtrex and Doxycycline as directed.

## 2017-09-18 ENCOUNTER — Other Ambulatory Visit: Payer: Self-pay | Admitting: Internal Medicine

## 2017-09-18 LAB — TESTOSTERONE: TESTOSTERONE: 349 ng/dL (ref 250–827)

## 2017-09-19 ENCOUNTER — Other Ambulatory Visit: Payer: Self-pay | Admitting: Internal Medicine

## 2017-09-19 ENCOUNTER — Telehealth: Payer: Self-pay | Admitting: Internal Medicine

## 2017-09-19 LAB — HERPES SIMPLEX VIRUS CULTURE
MICRO NUMBER:: 90555425
SPECIMEN QUALITY:: ADEQUATE

## 2017-09-19 NOTE — Telephone Encounter (Signed)
Mailed labs per Dr Beryle Quant request

## 2017-09-25 ENCOUNTER — Telehealth: Payer: Self-pay | Admitting: Internal Medicine

## 2017-09-25 NOTE — Telephone Encounter (Signed)
Faxed referral, office notes and labs to [facility]

## 2017-10-07 NOTE — Progress Notes (Signed)
   Subjective:    Patient ID: John Garcia, male    DOB: May 20, 1965, 52 y.o.   MRN: [id number]  HPI He is here today with a recurrent rash whole left buttock area.  He was seen at an urgent care in [place] about a month or so ago and was placed on an antibiotic and Valtrex.  Lesions have recurred.  Has never had this before until last month.    Review of Systems asking about having testosterone checked     Objective:   Physical Exam He has generalized macular erythema with vesicular lesions left buttock that seems to be consistent with herpes simplex.  We attempted to obtain a culture but the lesions have begun to heal.  Results of culture proved to be negative.  Serum testosterone checked at his request and is low normal at 349.  Referral will be made to [facility] to consider testosterone therapy.       Assessment & Plan:  Herpes simplex left buttock.  Treat with doxycycline 100 mg twice daily for 10 days and Valtrex  Addendum: Culture is negative but I feel that he did have herpes simplex type lesions.  This is likely to recur and he will need to have Valtrex on hand.

## 2017-10-23 ENCOUNTER — Ambulatory Visit (INDEPENDENT_AMBULATORY_CARE_PROVIDER_SITE_OTHER): Payer: BLUE CROSS/BLUE SHIELD | Admitting: Physician Assistant

## 2017-10-23 ENCOUNTER — Encounter (INDEPENDENT_AMBULATORY_CARE_PROVIDER_SITE_OTHER): Payer: Self-pay | Admitting: Physician Assistant

## 2017-10-23 ENCOUNTER — Ambulatory Visit (INDEPENDENT_AMBULATORY_CARE_PROVIDER_SITE_OTHER): Payer: BLUE CROSS/BLUE SHIELD

## 2017-10-23 DIAGNOSIS — Z96642 Presence of left artificial hip joint: Secondary | ICD-10-CM

## 2017-10-23 DIAGNOSIS — M25461 Effusion, right knee: Secondary | ICD-10-CM | POA: Diagnosis not present

## 2017-10-23 DIAGNOSIS — M2022 Hallux rigidus, left foot: Secondary | ICD-10-CM | POA: Diagnosis not present

## 2017-10-23 MED ORDER — METHYLPREDNISOLONE ACETATE 40 MG/ML IJ SUSP
40.0000 mg | INTRAMUSCULAR | Status: AC | PRN
  Administered 2017-10-23: 40 mg via INTRA_ARTICULAR

## 2017-10-23 MED ORDER — DICLOFENAC SODIUM 75 MG PO TBEC: 75.0000 mg | DELAYED_RELEASE_TABLET | Freq: Two times a day (BID) | ORAL | 0 refills | Status: DC

## 2017-10-23 MED ORDER — LIDOCAINE HCL 1 % IJ SOLN
5.0000 mL | INTRAMUSCULAR | Status: AC | PRN
  Administered 2017-10-23: 5 mL

## 2017-10-23 NOTE — Progress Notes (Signed)
Office Visit Note   Patient: John Garcia           Date of Birth: [birth year]           MRN: [id number] Visit Date: 10/23/2017              Requested by: Margaree MackintoshBaxley, Mary J, MD [address] [place] [id number] PCP: Margaree MackintoshBaxley, Mary J, MD   Assessment & Plan: Visit Diagnoses:  1. History of left hip replacement   2. Effusion, right knee   3. Hallux rigidus of left foot     Plan: See back in 2 weeks to check his response to the injection of the right knee.  Discussed with him quad strengthening exercises.  Like AP lateral views of both knees at return from baseline films.  Left hallux rigidus we will treat with topical Pennsaid samples were given.  Have him stop his ibuprofen.  We will start him on diclofenac orally.  Follow-Up Instructions: Return in about 2 weeks (around 11/06/2017).   Orders:  Orders Placed This Encounter  Procedures  . Large Joint Inj  . XR HIP UNILAT W OR W/O PELVIS 1V LEFT   No orders of the defined types were placed in this encounter.     Procedures: Large Joint Inj: R knee on 10/23/2017 5:24 PM Indications: pain Details: 22 G 1.5 in needle, superolateral approach  Arthrogram: No  Medications: 40 mg methylPREDNISolone acetate 40 MG/ML; 5 mL lidocaine 1 % Aspirate: 10 mL yellow Outcome: tolerated well, no immediate complications Procedure, treatment alternatives, risks and benefits explained, specific risks discussed. Consent was given by the patient. Immediately prior to procedure a time out was called to verify the correct patient, procedure, equipment, support staff and site/side marked as required. Patient was prepped and draped in the usual sterile fashion.       Clinical Data: No additional findings.   Subjective: Chief Complaint  Patient presents with  . Left Hip - Follow-up    HPI Total hip arthroplasty.  States overall the hip is doing well still has some discomfort after sitting for long time.  He also notes some  swelling still about the incision.  He does report that he had a rash around about the incision area one provider felt that he had shingles then he saw another provider who did a skin scraping he reports no organism was obtained from the scrapings however he was placed on antibiotics for period time the rash is not came back.  He has had no fevers chills.  He is having bilateral knee pain right greater than left.  He reports swelling in the right knee.  He also reports that his left knee would go out on him often several years ago and that he would reduce the knee by pulling up on a gate latch.  He has had no dislocation of the knee recently.  Said no new injury to either knee.  His left great toe also bothers him if he is walking for any period of time he has some swelling about the toe. Review of Systems Please see HPI otherwise negative  Objective: Vital Signs: There were no vitals taken for this visit.  Physical Exam  Constitutional: He is oriented to person, place, and time. He appears well-developed and well-nourished. No distress.  Cardiovascular: Intact distal pulses.  Pulmonary/Chest: Effort normal.  Neurological: He is alert and oriented to person, place, and time.  Skin: He is not diaphoretic.  Psychiatric: He has a  normal mood and affect.    Ortho Exam Left hip he has great range of motion of the hip without significant pain.  Surgical incisions healed well no signs of infection.  He does have some edema about the incision site but this does not feel like any type of seroma.  There is no drainage. Left great toe he has decreased range of motion through the MP joint.  Positive for edema about the region.  He has tenderness over the first MP joint.  No rashes skin lesions ulcerations erythema about the left foot. Bilateral knees good range of motion of both knees.  Right knee with patellofemoral crepitus.  No instability valgus varus stressing of either knee.  Right knee slight effusion.   No erythema ecchymosis or abnormal warmth of either knee.  Specialty Comments:  No specialty comments available.  Imaging: Xr Hip Unilat W Or W/o Pelvis 1v Left  Result Date: 10/23/2017 Lateral view of the left hip: Bilateral hips are well located.  Status post left total hip arthroplasty well-seated components.  No acute fractures or bony amounts.    PMFS History: Patient Active Problem List   Diagnosis Date Noted  . Unilateral primary osteoarthritis, left hip 10/16/2016  . Status post total replacement of left hip 10/16/2016  . Essential hypertension 02/09/2015  . Hyperlipidemia 01/10/2011   Past Medical History:  Diagnosis Date  . Arthritis   . Atrial flutter with rapid ventricular response (HCC)   . GERD (gastroesophageal reflux disease)   . History of hiatal hernia   . Hyperlipidemia   . Hypertension   . Obesity     Family History  Problem Relation Age of Onset  . Hypertension Mother     Past Surgical History:  Procedure Laterality Date  . TOTAL HIP ARTHROPLASTY Left 10/16/2016   Procedure: LEFT TOTAL HIP ARTHROPLASTY ANTERIOR APPROACH;  Surgeon: Kathryne Hitch, MD;  Location: [facility] OR;  Service: Orthopedics;  Laterality: Left;   Social History   Occupational History  . Not on file  Tobacco Use  . Smoking status: Never Smoker  . Smokeless tobacco: Former Engineer, water and Sexual Activity  . Alcohol use: Yes    Comment: coke and jack daniels 4x a week (couple oz.)  . Drug use: No  . Sexual activity: Not on file

## 2017-11-06 ENCOUNTER — Ambulatory Visit (INDEPENDENT_AMBULATORY_CARE_PROVIDER_SITE_OTHER): Payer: Self-pay

## 2017-11-06 ENCOUNTER — Ambulatory Visit (INDEPENDENT_AMBULATORY_CARE_PROVIDER_SITE_OTHER): Payer: BLUE CROSS/BLUE SHIELD | Admitting: Physician Assistant

## 2017-11-06 ENCOUNTER — Encounter (INDEPENDENT_AMBULATORY_CARE_PROVIDER_SITE_OTHER): Payer: Self-pay | Admitting: Physician Assistant

## 2017-11-06 DIAGNOSIS — M25562 Pain in left knee: Secondary | ICD-10-CM | POA: Diagnosis not present

## 2017-11-06 DIAGNOSIS — M25561 Pain in right knee: Secondary | ICD-10-CM | POA: Diagnosis not present

## 2017-11-06 MED ORDER — DICLOFENAC SODIUM 2 % TD SOLN: TRANSDERMAL | 1 refills | Status: DC

## 2017-11-06 NOTE — Progress Notes (Signed)
HPI: Mr. Luan PullingDunlap returns today follow-up of his right knee status post injection.  He states he still having swelling.  He had few days where he had a good response to the injection.  He has not started taking the diclofenac as of yet.  He did use the pen said however on his big toes and this made a huge difference with the hallux rigidus pain he was having.  He has had no new injury to the knees.  Does have pain in both knees worse with activities.  Again right greater than left.   Review of systems: Please see HPI otherwise negative  Physical exam: General well-developed well-nourished male no acute distress mood affect appropriate.  Psych alert and oriented x3 Bilateral knees he has good range of motion of both knees without significant pain.  He has significant crepitus patellofemoral region of both knees with passive range of motion.  No instability valgus varus stressing.  Left knee positive for effusion right knee no effusion.  Bilateral knees without erythema or ecchymosis .  Radiographs: Left knee 3 views moderate narrowing medial joint line.  Moderate patellofemoral changes.  No acute fractures bony ab maladies otherwise. Right knee 3 views show moderate patellofemoral changes and mild medial compartmental changes.  Otherwise the knee is well located no acute fracture.  Impression: Bilateral knee osteoarthritis Hallux rigidus  Plan: He will continue to apply the Pennsaid to his great toes over the MP joints as needed up to 4 g 4 times daily.  In regards to his knees think he would benefit from supplemental injection in both knees we will try to gain approval for this and have him back once this is been approved.  Placed him on diclofenac orally needs to monitor his blood pressure while on this medication no other NSAIDs while on this medication and this is discussed with him at length.  He will work on Dance movement psychotherapistquad strengthening.

## 2017-11-07 ENCOUNTER — Telehealth (INDEPENDENT_AMBULATORY_CARE_PROVIDER_SITE_OTHER): Payer: Self-pay

## 2017-11-07 NOTE — Telephone Encounter (Signed)
Right knee gel injection  

## 2017-11-07 NOTE — Telephone Encounter (Signed)
Noted  

## 2017-11-13 ENCOUNTER — Telehealth (INDEPENDENT_AMBULATORY_CARE_PROVIDER_SITE_OTHER): Payer: Self-pay

## 2017-11-13 NOTE — Telephone Encounter (Signed)
Submitted application online for SynviscOne, right knee. 

## 2017-11-18 ENCOUNTER — Other Ambulatory Visit (INDEPENDENT_AMBULATORY_CARE_PROVIDER_SITE_OTHER): Payer: Self-pay

## 2017-11-18 MED ORDER — DICLOFENAC SODIUM 1 % TD GEL: 2.0000 g | Freq: Four times a day (QID) | TRANSDERMAL | 2 refills | Status: DC

## 2017-11-20 IMAGING — CR DG HIP (WITH OR WITHOUT PELVIS) 2-3V*L*
3 series · 3 of 3 positions shown · non-contrast
Comparison: None in PACs

CLINICAL DATA: Two month history of left hip and groin discomfort
without recent trauma. Possible hernia.

EXAM:
DG HIP (WITH OR WITHOUT PELVIS) 2-3V LEFT

[w pelvis upright]
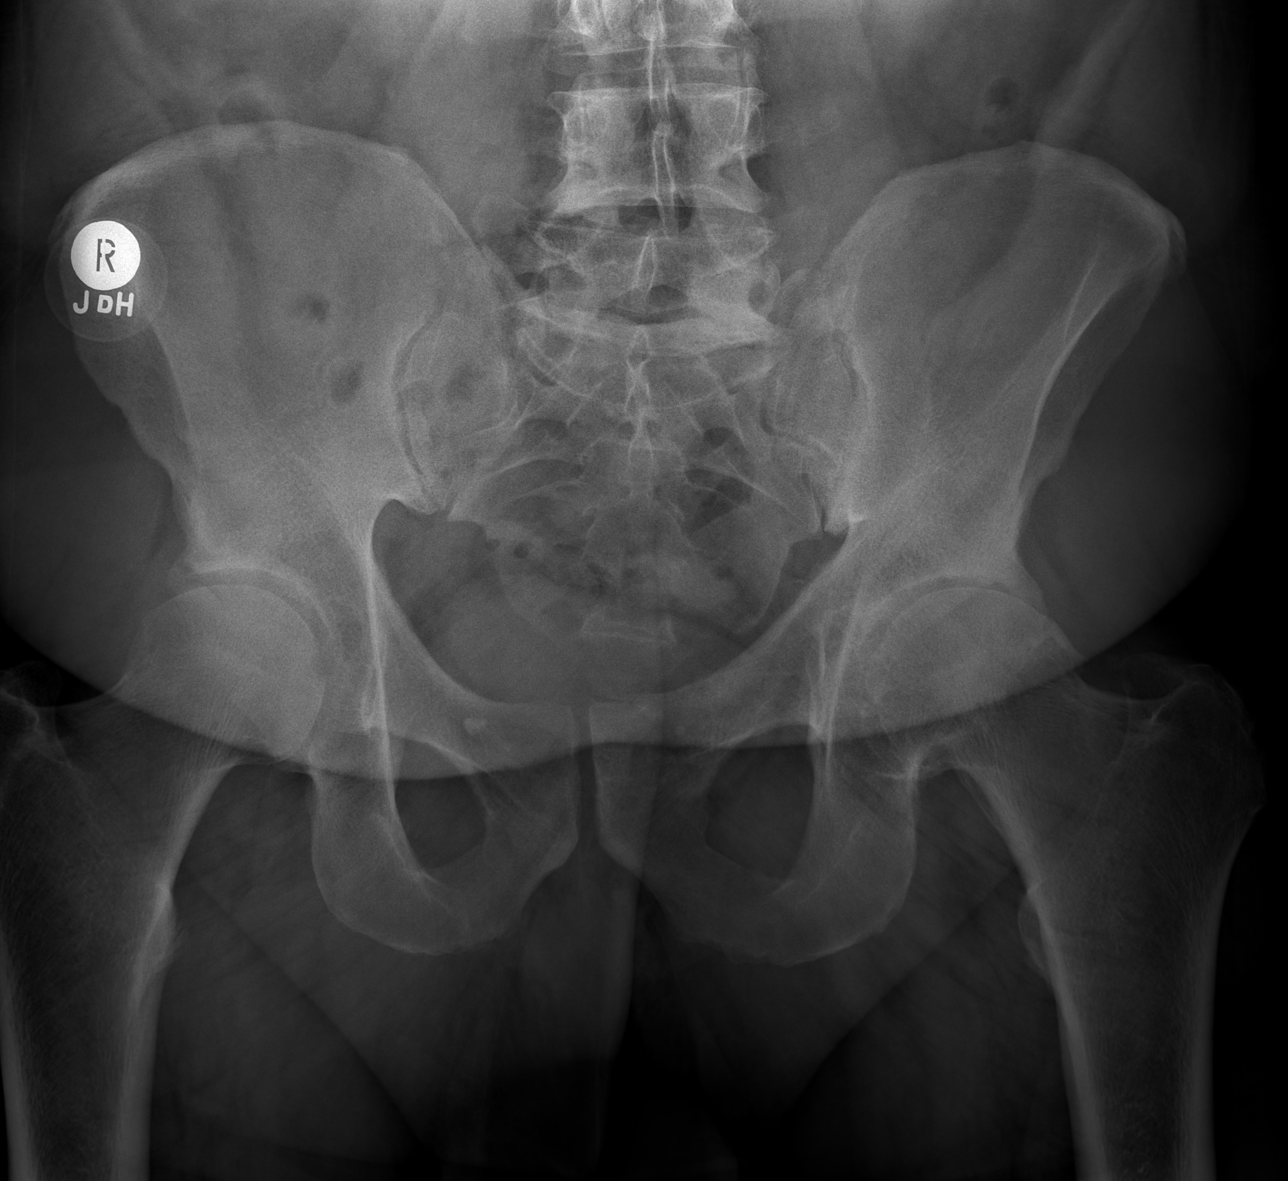

[w hip ap left]
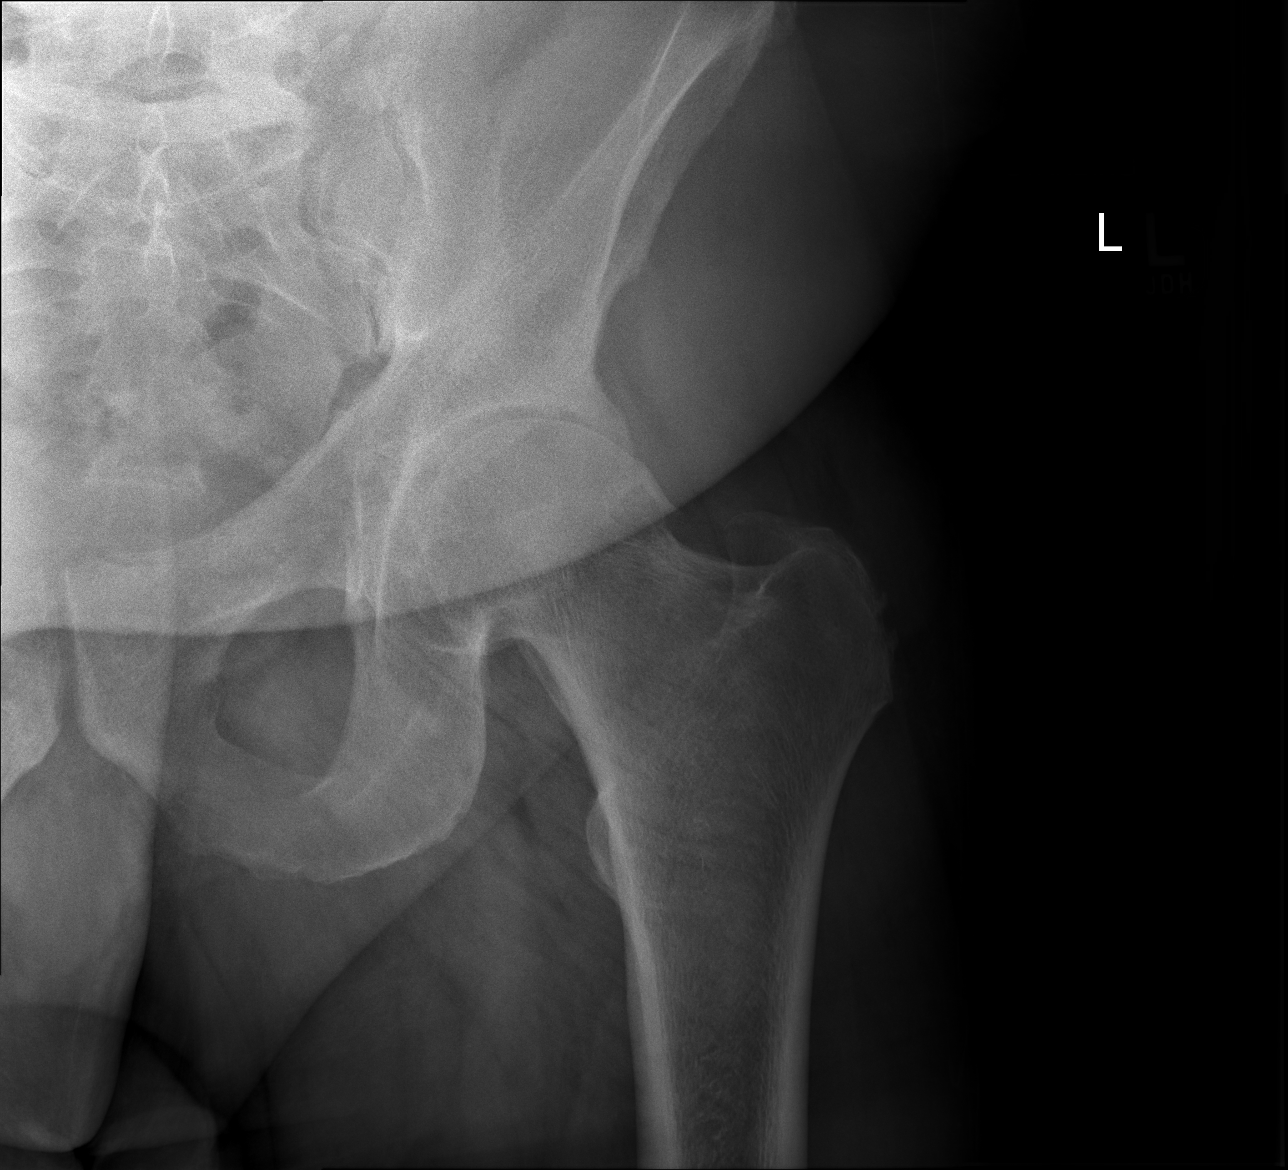

[w hip lat left]
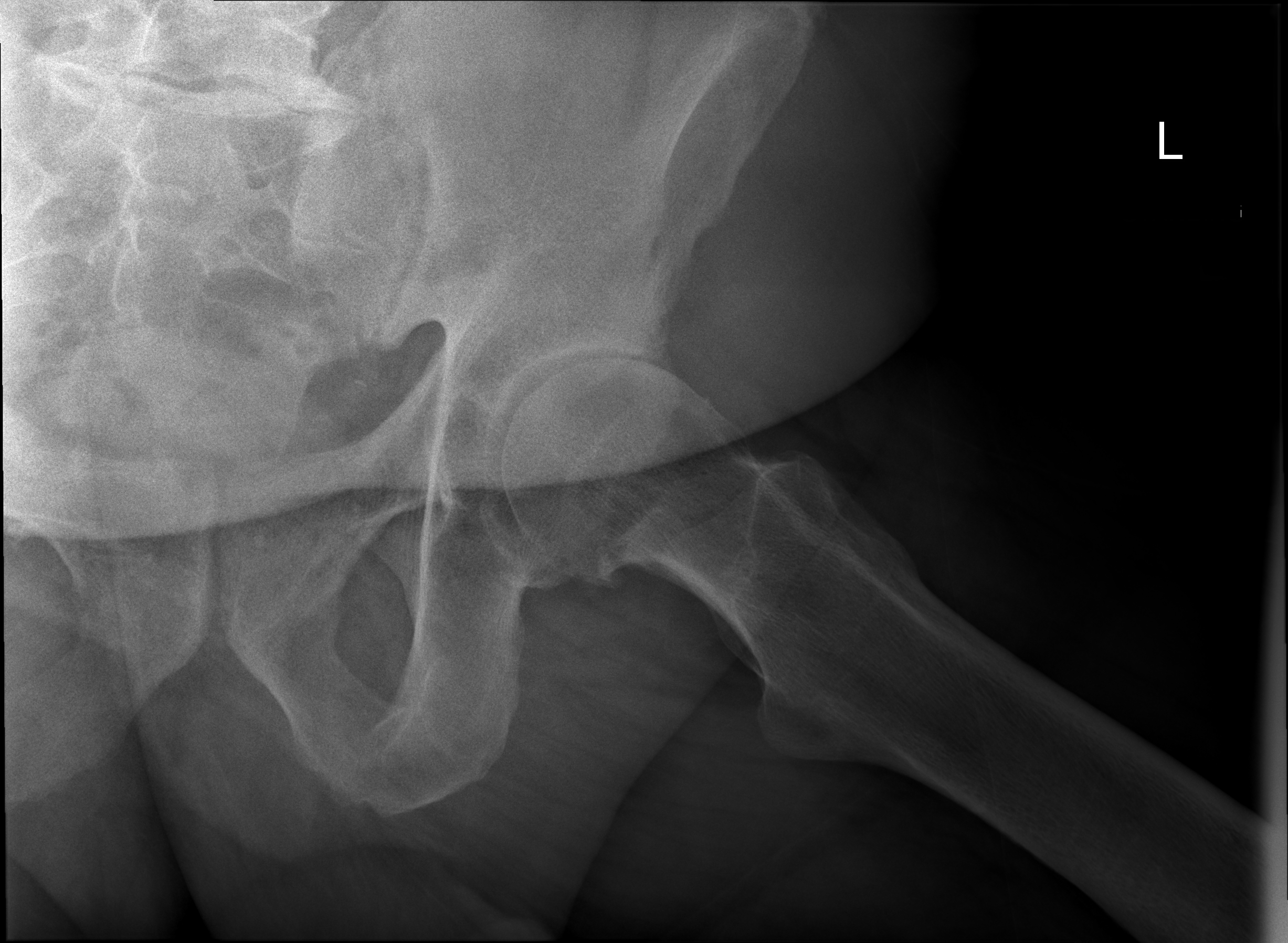

[3 of 3 positions shown; findings below may reference images not displayed]

FINDINGS: The bones are subjectively adequately mineralized. There is no lytic
lesion. There is no acute or old fracture. The observed portions of
the sacrum are normal.

AP and lateral views of the left hip reveal mild narrowing of the
superior aspect of the joint space. There may be an intra-articular
osteophyte involving the femoral head. The femoral neck,
intertrochanteric, and subtrochanteric regions are normal.
IMPRESSION: Mild to moderate osteoarthritic change of the left hip manifested by
narrowing of the superior aspect of the joint space and a possible
intra-articular osteophyte arising from the femoral head. No acute
fracture or dislocation.

## 2017-11-22 ENCOUNTER — Telehealth (INDEPENDENT_AMBULATORY_CARE_PROVIDER_SITE_OTHER): Payer: Self-pay | Admitting: Orthopaedic Surgery

## 2017-11-22 ENCOUNTER — Telehealth (INDEPENDENT_AMBULATORY_CARE_PROVIDER_SITE_OTHER): Payer: Self-pay

## 2017-11-22 NOTE — Telephone Encounter (Signed)
Judeth Corn[place]Stephanie [place] [employer] called needing prior [place] for Rx Diclofenac gel. The number to contact Judeth CornfieldStephanie is [phone]

## 2017-11-22 NOTE — Telephone Encounter (Signed)
PA required.  Faxed PA form to [employer] at [phone]. 

## 2017-11-26 ENCOUNTER — Telehealth (INDEPENDENT_AMBULATORY_CARE_PROVIDER_SITE_OTHER): Payer: Self-pay | Admitting: Orthopaedic Surgery

## 2017-11-26 NOTE — Telephone Encounter (Signed)
Will watch for from on fax, and respond.

## 2017-11-26 NOTE — Telephone Encounter (Signed)
John Garcia from [employer] called left voicemail message needing additional clinical information for prior auth on the Rx (Diclofenac Sodium). John Garcia said he is also going to fax the correct form and asked for the form to be faxed back. The number to contact John Garcia is [phone]    The fax# is [phone]

## 2017-11-27 ENCOUNTER — Telehealth (INDEPENDENT_AMBULATORY_CARE_PROVIDER_SITE_OTHER): Payer: Self-pay | Admitting: Orthopaedic Surgery

## 2017-11-27 NOTE — Telephone Encounter (Signed)
Sam from [employer] [employer] [place] left voicemail message stating she received auth request for (Diclofenac Sodium)  but, it was on a incorrect form. Sam said she have additional questions and asked for a call back. The number to contact Sam is [phone] Ref# [id number]

## 2017-11-27 NOTE — Telephone Encounter (Signed)
IC LMVM for her to call me.

## 2017-11-29 NOTE — Telephone Encounter (Signed)
Filled out the PA form they faxed, and I need to get John Garcia to sign then fax in again.

## 2017-12-02 NOTE — Telephone Encounter (Signed)
Morrie Sheldonshley to fax in.

## 2017-12-02 NOTE — Telephone Encounter (Signed)
Faxed

## 2017-12-07 ENCOUNTER — Other Ambulatory Visit (INDEPENDENT_AMBULATORY_CARE_PROVIDER_SITE_OTHER): Payer: Self-pay | Admitting: Physician Assistant

## 2017-12-12 ENCOUNTER — Telehealth (INDEPENDENT_AMBULATORY_CARE_PROVIDER_SITE_OTHER): Payer: Self-pay

## 2017-12-12 NOTE — Telephone Encounter (Signed)
Patient is approved for SynviscOne, right knee. Buy & Bill Covered at 100% PA Approval# is [id number] Valid 07/12/209- 11/22/2018

## 2017-12-31 ENCOUNTER — Encounter (INDEPENDENT_AMBULATORY_CARE_PROVIDER_SITE_OTHER): Payer: Self-pay | Admitting: Orthopaedic Surgery

## 2017-12-31 ENCOUNTER — Ambulatory Visit (INDEPENDENT_AMBULATORY_CARE_PROVIDER_SITE_OTHER): Payer: BLUE CROSS/BLUE SHIELD | Admitting: Orthopaedic Surgery

## 2017-12-31 DIAGNOSIS — M1711 Unilateral primary osteoarthritis, right knee: Secondary | ICD-10-CM | POA: Diagnosis not present

## 2017-12-31 MED ORDER — HYLAN G-F 20 48 MG/6ML IX SOSY
48.0000 mg | PREFILLED_SYRINGE | INTRA_ARTICULAR | Status: AC | PRN
  Administered 2017-12-31: 48 mg via INTRA_ARTICULAR

## 2017-12-31 NOTE — Progress Notes (Signed)
   Procedure Note  Patient: John Garcia             Date of Birth: [birth year]           MRN: [id number]             Visit Date: 12/31/2017  Procedures: Visit Diagnoses: Unilateral primary osteoarthritis, right knee  Large Joint Inj: R knee on 12/31/2017 11:04 AM Indications: pain and diagnostic evaluation Details: 22 G 1.5 in needle, superolateral approach  Arthrogram: No  Medications: 48 mg Hylan 48 MG/6ML Outcome: tolerated well, no immediate complications Procedure, treatment alternatives, risks and benefits explained, specific risks discussed. Consent was given by the patient. Immediately prior to procedure a time out was called to verify the correct patient, procedure, equipment, support staff and site/side marked as required. Patient was prepped and draped in the usual sterile fashion.    The patient is here for scheduled Synvisc 1 injection in his right knee to treat the pain from moderate osteoarthritis.  He is a lot of popping behind his kneecap he is only [age] years old and has had a left total hip arthroplasty.  On exam he says significant pain and slight swelling of his right knee.  He understands the risk minutes behind injections like this in the rationale for providing it.  I did place the injection without significant difficulties.  Like to see him back in 6 weeks to see how he is doing overall with that knee.  If he continues to have problems we have not an MRI of his knee that would be my next step as well.  All question concerns were answered and addressed.

## 2018-02-06 ENCOUNTER — Other Ambulatory Visit: Payer: Self-pay | Admitting: Internal Medicine

## 2018-02-11 ENCOUNTER — Ambulatory Visit: Payer: BLUE CROSS/BLUE SHIELD | Admitting: Internal Medicine

## 2018-02-11 DIAGNOSIS — E785 Hyperlipidemia, unspecified: Secondary | ICD-10-CM

## 2018-02-12 ENCOUNTER — Ambulatory Visit (INDEPENDENT_AMBULATORY_CARE_PROVIDER_SITE_OTHER): Payer: BLUE CROSS/BLUE SHIELD | Admitting: Orthopaedic Surgery

## 2018-02-14 ENCOUNTER — Ambulatory Visit: Payer: BLUE CROSS/BLUE SHIELD | Admitting: Internal Medicine

## 2018-02-28 ENCOUNTER — Ambulatory Visit: Payer: BLUE CROSS/BLUE SHIELD | Admitting: Internal Medicine

## 2018-03-03 ENCOUNTER — Ambulatory Visit: Payer: BLUE CROSS/BLUE SHIELD | Admitting: Internal Medicine

## 2018-03-03 ENCOUNTER — Encounter: Payer: Self-pay | Admitting: Internal Medicine

## 2018-03-03 VITALS — BP 110/80 | HR 84 | Wt 285.0 lb

## 2018-03-03 DIAGNOSIS — I10 Essential (primary) hypertension: Secondary | ICD-10-CM

## 2018-03-03 DIAGNOSIS — Z6834 Body mass index (BMI) 34.0-34.9, adult: Secondary | ICD-10-CM | POA: Diagnosis not present

## 2018-03-03 NOTE — Patient Instructions (Signed)
Continue to monitor BP. Call if persistently elevated.

## 2018-03-03 NOTE — Progress Notes (Signed)
   Subjective:    Patient ID: John Garcia, male    DOB: 12/19/1965, 52 y.o.   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 Finally came down to 130/80 and he was cleared by examiner.  Says BP was elevated yesterday at 170 systolic. Was relaxing at home and denied being stressed.  Took Losartan this am.  BP fine at 110/80 in each arm.  Some situational stress with work and daughter.    Review of Systems see above- denies chest pain, headache. Says joints ache. Needs to lose weight.Lost 6 pounds since May. Generally has light drink of Tequila at night.     Objective:   Physical Exam  Constitutional: He appears well-developed and well-nourished. No distress.  Neck: Neck supple. No JVD present.  Cardiovascular: Normal rate, regular rhythm and normal heart sounds.  No murmur heard. Pulmonary/Chest: Effort normal and breath sounds normal. No stridor. No respiratory distress. He has no wheezes. He has no rales.  Skin: Skin is warm and dry. He is not diaphoretic.  Vitals reviewed.         Assessment & Plan:  Elevated BP readings- OK today. Had Losartan this am. Call if elevation persistent. Watch salt. Rec: diet and exercise and cut back on ETOH  Situational stress could be a factor.

## 2018-05-07 ENCOUNTER — Other Ambulatory Visit: Payer: Self-pay | Admitting: Internal Medicine

## 2018-05-08 ENCOUNTER — Other Ambulatory Visit: Payer: Self-pay | Admitting: Internal Medicine

## 2018-05-26 ENCOUNTER — Telehealth (INDEPENDENT_AMBULATORY_CARE_PROVIDER_SITE_OTHER): Payer: Self-pay | Admitting: Orthopaedic Surgery

## 2018-05-26 ENCOUNTER — Other Ambulatory Visit (INDEPENDENT_AMBULATORY_CARE_PROVIDER_SITE_OTHER): Payer: Self-pay

## 2018-05-26 MED ORDER — DICLOFENAC SODIUM 75 MG PO TBEC: 75.0000 mg | DELAYED_RELEASE_TABLET | Freq: Two times a day (BID) | ORAL | 1 refills | Status: DC

## 2018-05-26 NOTE — Telephone Encounter (Signed)
Sent to his pharmacy

## 2018-05-26 NOTE — Telephone Encounter (Signed)
Pharmacy [facility] [place]      Medication refill   Diclofenac

## 2018-08-10 IMAGING — DX DG PORTABLE PELVIS
1 series · 2 of 2 positions shown · non-contrast
Comparison: None.

CLINICAL DATA: Postop left total hip arthroplasty

EXAM:
PORTABLE PELVIS 1-2 VIEWS

[Series 1: pelvis · 0.14mm/px · 2 of 2 slices shown]
[im 1/2]
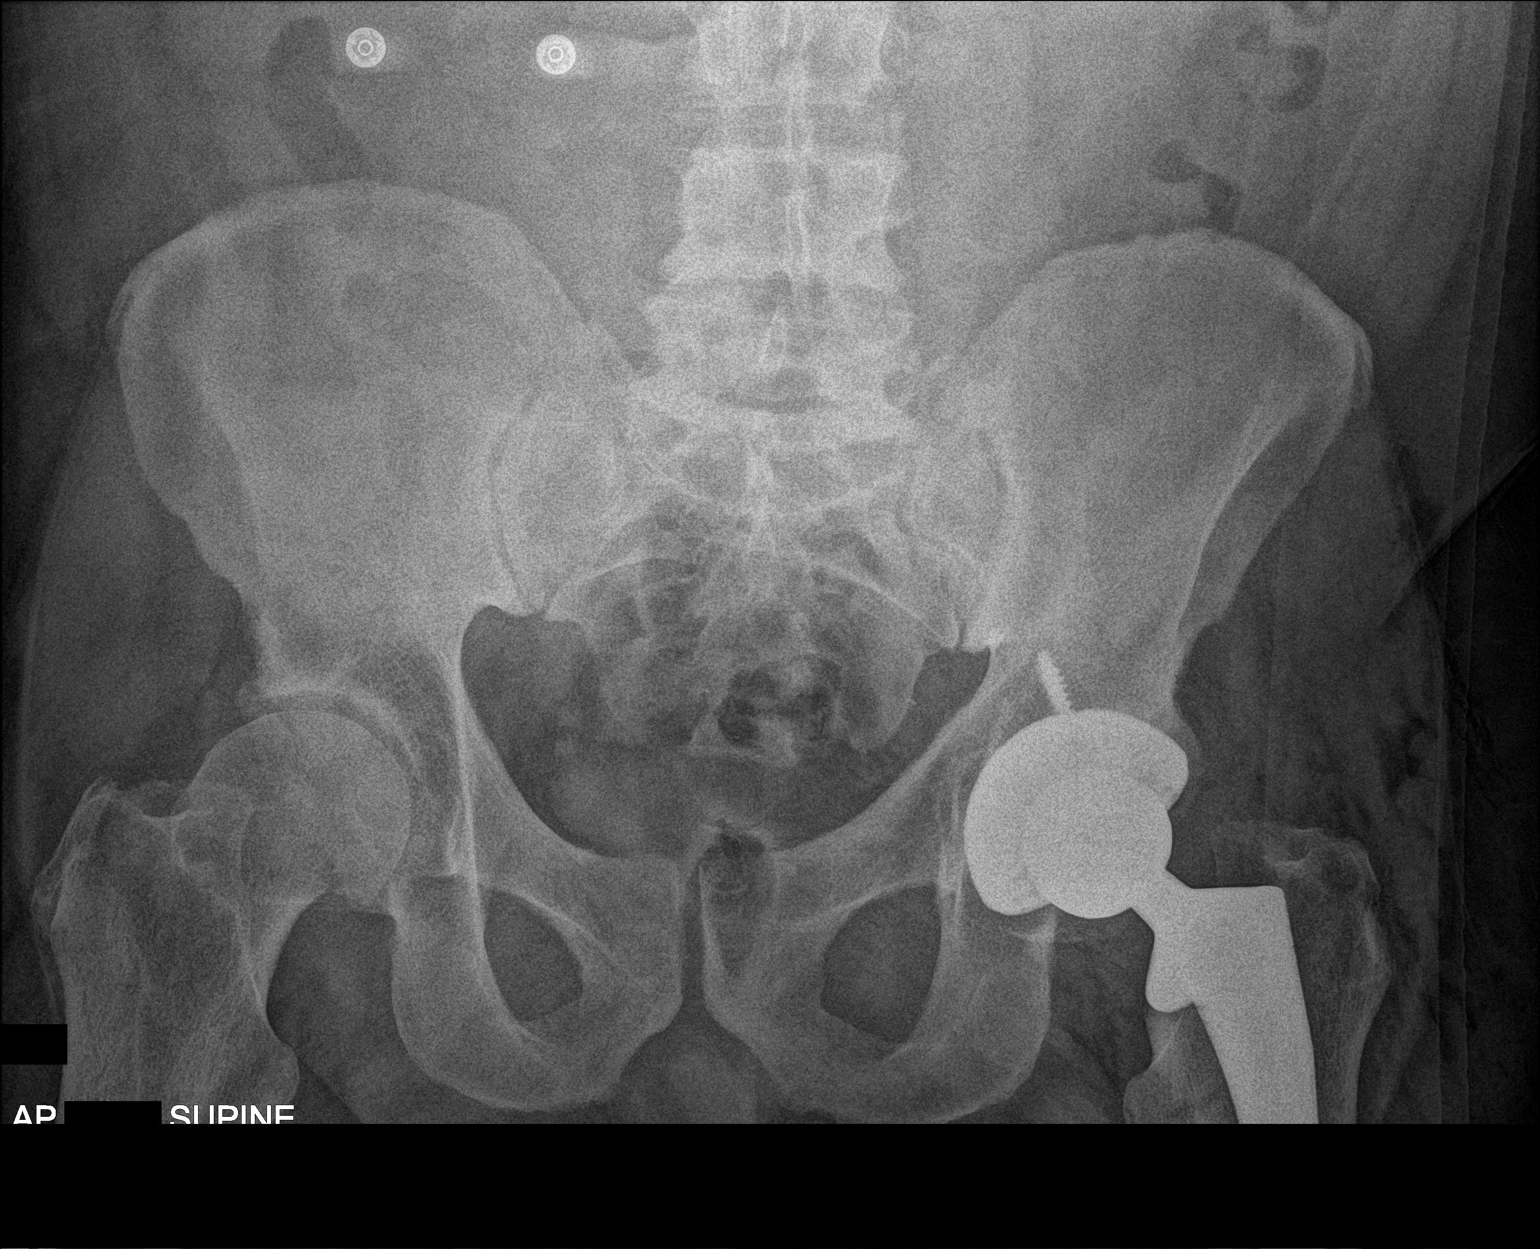
[im 2/2]
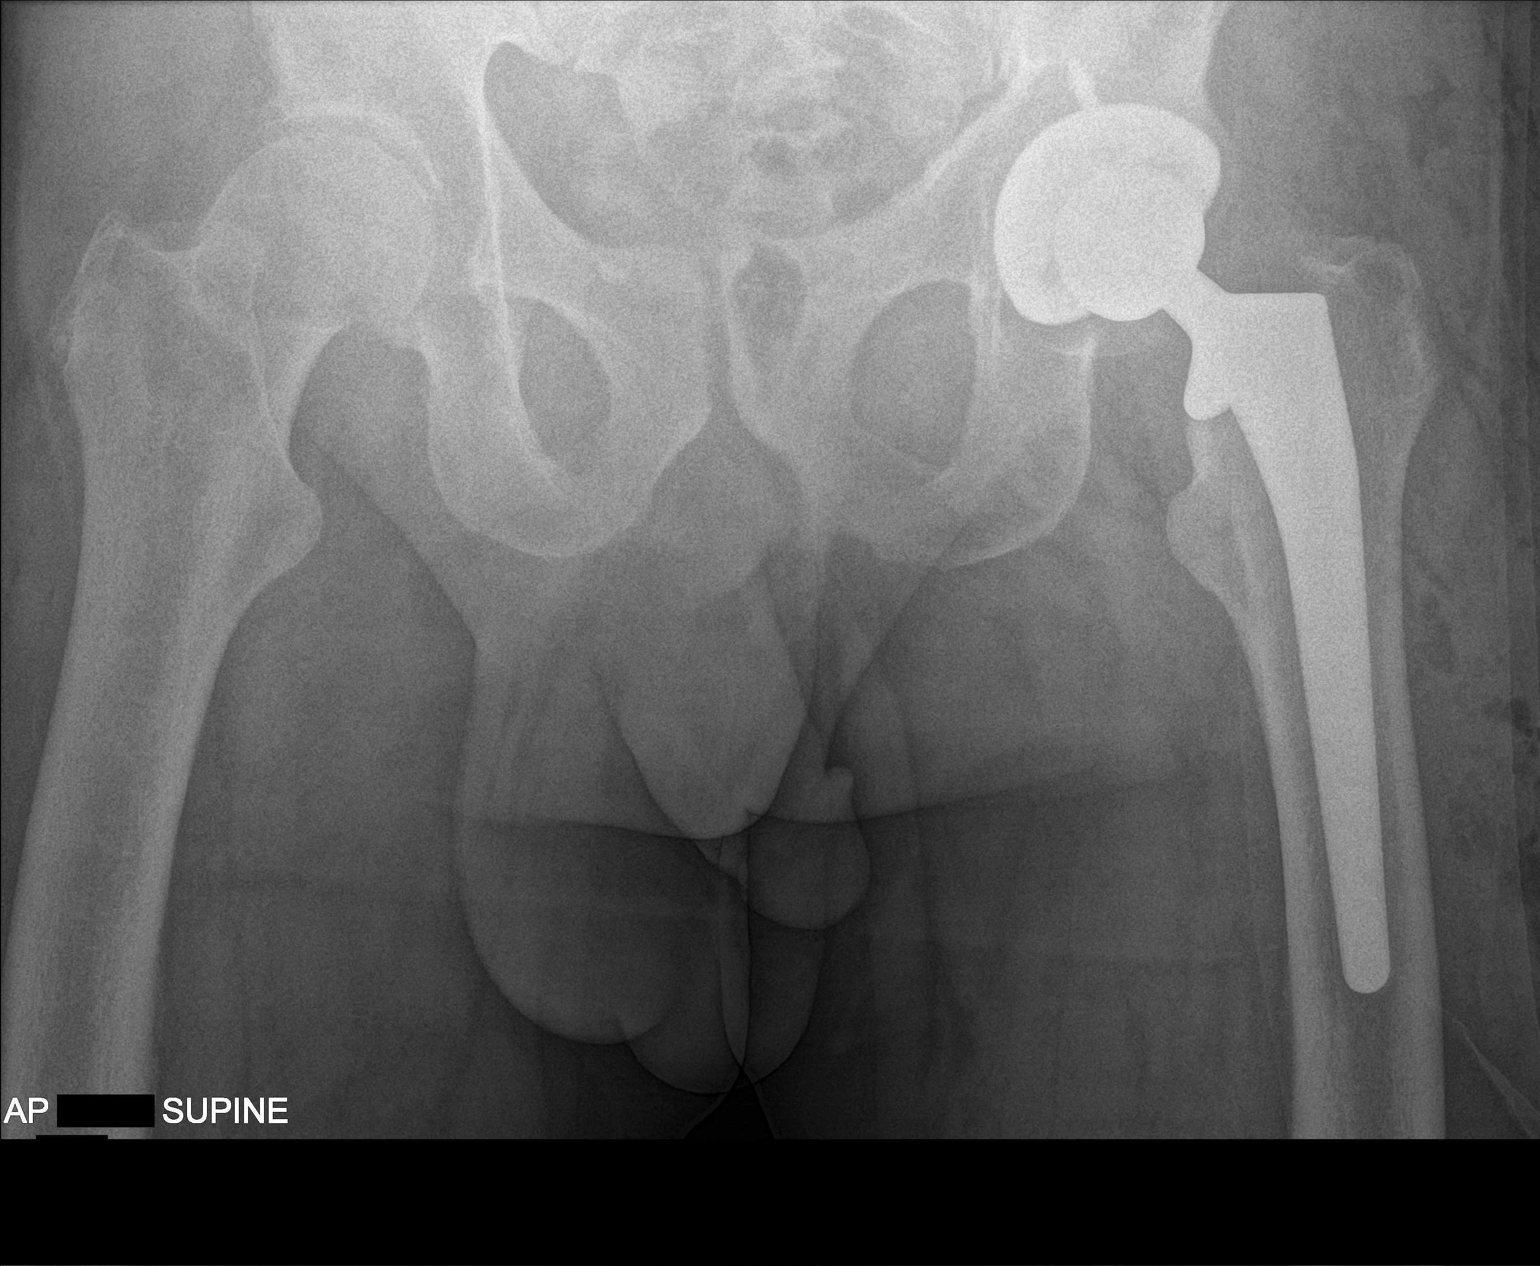

[2 of 2 positions shown; findings below may reference images not displayed]

FINDINGS: Left total hip arthroplasty is anatomically aligned. No breakage or
loosening of the hardware.
IMPRESSION: Left total hip arthroplasty anatomically aligned.

## 2018-10-12 ENCOUNTER — Other Ambulatory Visit (INDEPENDENT_AMBULATORY_CARE_PROVIDER_SITE_OTHER): Payer: Self-pay | Admitting: Orthopaedic Surgery

## 2018-10-12 NOTE — Telephone Encounter (Signed)
Rx request 

## 2018-11-03 ENCOUNTER — Encounter: Payer: BC Managed Care – PPO | Admitting: Internal Medicine

## 2018-11-04 ENCOUNTER — Encounter: Payer: Self-pay | Admitting: Internal Medicine

## 2018-11-04 DIAGNOSIS — Z0000 Encounter for general adult medical examination without abnormal findings: Secondary | ICD-10-CM

## 2018-11-05 ENCOUNTER — Other Ambulatory Visit: Payer: Self-pay | Admitting: Internal Medicine

## 2018-11-12 ENCOUNTER — Other Ambulatory Visit: Payer: Self-pay | Admitting: Internal Medicine

## 2018-12-01 ENCOUNTER — Other Ambulatory Visit: Payer: Self-pay

## 2018-12-01 MED ORDER — LOSARTAN POTASSIUM 100 MG PO TABS: 100.0000 mg | ORAL_TABLET | Freq: Every day | ORAL | 1 refills | Status: DC

## 2018-12-08 ENCOUNTER — Other Ambulatory Visit (INDEPENDENT_AMBULATORY_CARE_PROVIDER_SITE_OTHER): Payer: Self-pay | Admitting: Orthopaedic Surgery

## 2018-12-24 DIAGNOSIS — R05 Cough: Secondary | ICD-10-CM | POA: Diagnosis not present

## 2018-12-24 DIAGNOSIS — J019 Acute sinusitis, unspecified: Secondary | ICD-10-CM | POA: Diagnosis not present

## 2019-01-15 DIAGNOSIS — J209 Acute bronchitis, unspecified: Secondary | ICD-10-CM | POA: Diagnosis not present

## 2019-01-15 DIAGNOSIS — R05 Cough: Secondary | ICD-10-CM | POA: Diagnosis not present

## 2019-03-04 ENCOUNTER — Telehealth: Payer: Self-pay | Admitting: Orthopaedic Surgery

## 2019-03-04 ENCOUNTER — Other Ambulatory Visit: Payer: Self-pay

## 2019-03-04 MED ORDER — DICLOFENAC SODIUM 75 MG PO TBEC: 75.0000 mg | DELAYED_RELEASE_TABLET | Freq: Two times a day (BID) | ORAL | 1 refills | Status: DC

## 2019-03-04 NOTE — Telephone Encounter (Signed)
Patient called requesting an RX refill on his Diclofenac.  [facility] [place].  CB#[id number].  Thank you.

## 2019-03-04 NOTE — Telephone Encounter (Signed)
Faxed

## 2019-03-24 ENCOUNTER — Other Ambulatory Visit: Payer: Self-pay

## 2019-03-24 ENCOUNTER — Ambulatory Visit: Payer: BC Managed Care – PPO | Admitting: Internal Medicine

## 2019-03-24 ENCOUNTER — Encounter: Payer: Self-pay | Admitting: Internal Medicine

## 2019-03-24 VITALS — BP 110/80 | HR 68 | Temp 98.1°F | Ht 76.0 in | Wt 290.0 lb

## 2019-03-24 DIAGNOSIS — Z6835 Body mass index (BMI) 35.0-35.9, adult: Secondary | ICD-10-CM | POA: Diagnosis not present

## 2019-03-24 DIAGNOSIS — M21611 Bunion of right foot: Secondary | ICD-10-CM

## 2019-03-24 DIAGNOSIS — Z131 Encounter for screening for diabetes mellitus: Secondary | ICD-10-CM

## 2019-03-24 DIAGNOSIS — E7849 Other hyperlipidemia: Secondary | ICD-10-CM | POA: Diagnosis not present

## 2019-03-24 DIAGNOSIS — Z23 Encounter for immunization: Secondary | ICD-10-CM | POA: Diagnosis not present

## 2019-03-24 DIAGNOSIS — Z0000 Encounter for general adult medical examination without abnormal findings: Secondary | ICD-10-CM | POA: Diagnosis not present

## 2019-03-24 DIAGNOSIS — Z125 Encounter for screening for malignant neoplasm of prostate: Secondary | ICD-10-CM

## 2019-03-24 DIAGNOSIS — Z20822 Contact with and (suspected) exposure to covid-19: Secondary | ICD-10-CM

## 2019-03-24 DIAGNOSIS — M21612 Bunion of left foot: Secondary | ICD-10-CM

## 2019-03-24 DIAGNOSIS — M25562 Pain in left knee: Secondary | ICD-10-CM

## 2019-03-24 DIAGNOSIS — Z20828 Contact with and (suspected) exposure to other viral communicable diseases: Secondary | ICD-10-CM | POA: Diagnosis not present

## 2019-03-24 DIAGNOSIS — G8929 Other chronic pain: Secondary | ICD-10-CM

## 2019-03-24 DIAGNOSIS — I10 Essential (primary) hypertension: Secondary | ICD-10-CM | POA: Diagnosis not present

## 2019-03-24 DIAGNOSIS — M25561 Pain in right knee: Secondary | ICD-10-CM

## 2019-03-24 DIAGNOSIS — M170 Bilateral primary osteoarthritis of knee: Secondary | ICD-10-CM

## 2019-03-24 DIAGNOSIS — E782 Mixed hyperlipidemia: Secondary | ICD-10-CM

## 2019-03-24 LAB — POCT URINALYSIS DIPSTICK
Appearance: NEGATIVE
Bilirubin, UA: NEGATIVE
Blood, UA: NEGATIVE
Glucose, UA: NEGATIVE
Ketones, UA: NEGATIVE
Leukocytes, UA: NEGATIVE
Nitrite, UA: NEGATIVE
Odor: NEGATIVE
Protein, UA: NEGATIVE
Spec Grav, UA: 1.01 (ref 1.010–1.025)
Urobilinogen, UA: 0.2 E.U./dL
pH, UA: 6.5 (ref 5.0–8.0)

## 2019-03-24 MED ORDER — LOSARTAN POTASSIUM 100 MG PO TABS: 100.0000 mg | ORAL_TABLET | Freq: Every day | ORAL | 3 refills | Status: DC

## 2019-03-24 MED ORDER — ATORVASTATIN CALCIUM 20 MG PO TABS: 20.0000 mg | ORAL_TABLET | Freq: Every day | ORAL | 3 refills | Status: DC

## 2019-03-24 MED ORDER — MELOXICAM 15 MG PO TABS: 15.0000 mg | ORAL_TABLET | Freq: Every day | ORAL | 3 refills | Status: DC

## 2019-03-24 NOTE — Patient Instructions (Signed)
It was a pleasure to see you today. Try to lose weight. RTC in 6 months. Continue same meds. Labs drawn and pending.

## 2019-03-24 NOTE — Progress Notes (Signed)
   Subjective:    Patient ID: John Garcia, male    DOB: 1965-10-08, 53 y.o.   MRN: [id number]  HPI  53 year old Male for health maintenance exam and evaluation of medical issues.  He has a history of hypertension and hyperlipidemia.  History of atrial fibrillation with rapid ventricular response in 2006.  He was hospitalized and treated with IV Cardizem and subsequently converted to sinus rhythm.  He had a cardiac cath that showed no significant coronary artery disease and has never had a further episode and is not on any antiarrhythmic medication.  Weight is an issue.  Not able to get around due to chronic knee pain.  In 2014 he weighed 251 pounds.  Now weighs 290 pounds and BMI is 35.30  He is allergic to bee stings.  History of left lower lobe pneumonia 2008.  Left hip arthroplasty by Dr. Ninfa Linden in June 2018.  Social history: He is divorced.  2 daughters.  2 grandchildren.  Youngest daughter resides with him and competes in rodeo's.  Oldest daughter is married.  Ex-wife remarried.  He has male partner who resides with him.  He is a Office manager.  Does not smoke.  Social alcohol consumption.  Family history: Mother with history of diabetes, hypertension, hyperlipidemia and obesity.  Father with history of coronary artery disease, melanoma, hypertension, morbid obesity, sleep apnea, lumbar spinal stenosis.  1 sister with mild hypertension and migraine headaches.  Review of Systems  Weighed 285 pounds Oct 2019.  Had febrile illness after trip to [facility] December 2019 with cough. Exposure to Covid 19 in July.  History of bunions  Has never had screening colonoscopy-he is to consider this  Flu vaccine given     Objective:   Physical Exam BP 110/80  pulse 68, Temp 98.1, BMI 35.30 weight 290 pounds       Assessment & Plan:   Essential HTN-stable on losartan 100 mg daily.  Bilateral Knee pain-needs to see orthopedist.  Had right knee injected June 2019.  BMP had injection of  Hyalgan in August 2019 Diclofenac prescribed previously by orthopedist.  He has been diagnosed with bilateral knee osteoarthritis.  Hyperlipidemia-LDL is 111 and stable from April 2019.  Continue Lipitor 20 mg daily  Possible COVID-19 exposure-wants antibody test.  Has traveled out of state.  Addendum: Antibody to COVID-19 negative  Family history of diabetes-hemoglobin A1c is normal  Bunions-bilateral  Morbid obesity: Discussion regarding diet and exercise and weight loss.  BMI is 35.30.  Decrease red meat consumption.  Try to get more exercise but this is limited due to knee pain.  Family history of obesity in both parents.  Hemoglobin A1c is normal.  Plan: Continue to work on diet, exercise and weight loss and follow-up in 6 months.  Take precautions regarding COVID-19.  Wearing facemask and limit exposure as much as possible.  Covid risk of complication score is 3.  Continue current medications.

## 2019-03-25 LAB — LIPID PANEL
Cholesterol: 181 mg/dL (ref <200)
HDL: 44 mg/dL (ref >=40)
LDL Cholesterol (Calc): 111 mg/dL (calc) — ABNORMAL HIGH
Non-HDL Cholesterol (Calc): 137 mg/dL (calc) — ABNORMAL HIGH (ref <130)
Total CHOL/HDL Ratio: 4.1 (calc) (ref <5.0)
Triglycerides: 143 mg/dL (ref <150)

## 2019-03-25 LAB — CBC WITH DIFFERENTIAL/PLATELET
Absolute Monocytes: 623 cells/uL (ref 200–950)
Basophils Absolute: 38 cells/uL (ref 0–200)
Basophils Relative: 0.5 %
Eosinophils Absolute: 158 cells/uL (ref 15–500)
Eosinophils Relative: 2.1 %
HCT: 46.8 % (ref 38.5–50.0)
Hemoglobin: 15.8 g/dL (ref 13.2–17.1)
Lymphs Abs: 2325 cells/uL (ref 850–3900)
MCH: 31.8 pg (ref 27.0–33.0)
MCHC: 33.8 g/dL (ref 32.0–36.0)
MCV: 94.2 fL (ref 80.0–100.0)
MPV: 11 fL (ref 7.5–12.5)
Monocytes Relative: 8.3 %
Neutro Abs: 4358 cells/uL (ref 1500–7800)
Neutrophils Relative %: 58.1 %
Platelets: 189 10*3/uL (ref 140–400)
RBC: 4.97 10*6/uL (ref 4.20–5.80)
RDW: 12.1 % (ref 11.0–15.0)
Total Lymphocyte: 31 %
WBC: 7.5 10*3/uL (ref 3.8–10.8)

## 2019-03-25 LAB — HEMOGLOBIN A1C
Hgb A1c MFr Bld: 5.2 % of total Hgb (ref <5.7)
Mean Plasma Glucose: 103 (calc)
eAG (mmol/L): 5.7 (calc)

## 2019-03-25 LAB — COMPLETE METABOLIC PANEL WITH GFR
AG Ratio: 1.8 (calc) (ref 1.0–2.5)
ALT: 44 U/L (ref 9–46)
AST: 22 U/L (ref 10–35)
Albumin: 4.4 g/dL (ref 3.6–5.1)
Alkaline phosphatase (APISO): 92 U/L (ref 35–144)
BUN: 17 mg/dL (ref 7–25)
CO2: 26 mmol/L (ref 20–32)
Calcium: 9.4 mg/dL (ref 8.6–10.3)
Chloride: 103 mmol/L (ref 98–110)
Creat: 0.72 mg/dL (ref 0.70–1.33)
GFR, Est African American: 125 mL/min/{1.73_m2} (ref >=60)
GFR, Est Non African American: 108 mL/min/{1.73_m2} (ref >=60)
Globulin: 2.4 g/dL (calc) (ref 1.9–3.7)
Glucose, Bld: 91 mg/dL (ref 65–99)
Potassium: 4.4 mmol/L (ref 3.5–5.3)
Sodium: 138 mmol/L (ref 135–146)
Total Bilirubin: 0.5 mg/dL (ref 0.2–1.2)
Total Protein: 6.8 g/dL (ref 6.1–8.1)

## 2019-03-25 LAB — PSA: PSA: 0.1 ng/mL (ref <=4.0)

## 2019-03-25 LAB — SAR COV2 SEROLOGY (COVID19)AB(IGG),IA: SARS CoV2 AB IGG: NEGATIVE

## 2019-09-28 ENCOUNTER — Other Ambulatory Visit: Payer: Self-pay | Admitting: Orthopaedic Surgery

## 2019-12-31 ENCOUNTER — Other Ambulatory Visit: Payer: Self-pay | Admitting: Orthopaedic Surgery

## 2019-12-31 ENCOUNTER — Other Ambulatory Visit: Payer: Self-pay

## 2020-05-12 ENCOUNTER — Other Ambulatory Visit: Payer: Self-pay | Admitting: Orthopaedic Surgery

## 2020-06-01 ENCOUNTER — Other Ambulatory Visit: Payer: Self-pay | Admitting: Internal Medicine

## 2020-06-02 ENCOUNTER — Other Ambulatory Visit: Payer: Self-pay | Admitting: Internal Medicine

## 2020-06-07 ENCOUNTER — Telehealth: Payer: Self-pay | Admitting: Internal Medicine

## 2020-06-07 ENCOUNTER — Other Ambulatory Visit: Payer: Self-pay | Admitting: Internal Medicine

## 2020-06-07 MED ORDER — LOSARTAN POTASSIUM 100 MG PO TABS: 100.0000 mg | ORAL_TABLET | Freq: Every day | ORAL | 0 refills | Status: DC

## 2020-06-07 NOTE — Telephone Encounter (Signed)
Done

## 2020-06-07 NOTE — Telephone Encounter (Signed)
Please refill  all hismeds until CPE

## 2020-06-07 NOTE — Addendum Note (Signed)
Addended by: Gregery Na on: 06/07/2020 02:39 PM   Modules accepted: Orders

## 2020-06-07 NOTE — Telephone Encounter (Signed)
Patient called and schedule CPE 

## 2020-06-13 ENCOUNTER — Ambulatory Visit (INDEPENDENT_AMBULATORY_CARE_PROVIDER_SITE_OTHER): Payer: BC Managed Care – PPO | Admitting: Internal Medicine

## 2020-06-13 ENCOUNTER — Other Ambulatory Visit: Payer: Self-pay

## 2020-06-13 ENCOUNTER — Encounter: Payer: Self-pay | Admitting: Internal Medicine

## 2020-06-13 VITALS — BP 140/100 | HR 74 | Ht 76.0 in | Wt 300.0 lb

## 2020-06-13 DIAGNOSIS — M170 Bilateral primary osteoarthritis of knee: Secondary | ICD-10-CM

## 2020-06-13 DIAGNOSIS — E7800 Pure hypercholesterolemia, unspecified: Secondary | ICD-10-CM | POA: Diagnosis not present

## 2020-06-13 DIAGNOSIS — E782 Mixed hyperlipidemia: Secondary | ICD-10-CM | POA: Diagnosis not present

## 2020-06-13 DIAGNOSIS — G8929 Other chronic pain: Secondary | ICD-10-CM

## 2020-06-13 DIAGNOSIS — M25562 Pain in left knee: Secondary | ICD-10-CM

## 2020-06-13 DIAGNOSIS — L739 Follicular disorder, unspecified: Secondary | ICD-10-CM

## 2020-06-13 DIAGNOSIS — I10 Essential (primary) hypertension: Secondary | ICD-10-CM

## 2020-06-13 DIAGNOSIS — Z0000 Encounter for general adult medical examination without abnormal findings: Secondary | ICD-10-CM | POA: Diagnosis not present

## 2020-06-13 DIAGNOSIS — M25561 Pain in right knee: Secondary | ICD-10-CM | POA: Diagnosis not present

## 2020-06-13 LAB — POCT URINALYSIS DIPSTICK
Appearance: NEGATIVE
Bilirubin, UA: NEGATIVE
Blood, UA: NEGATIVE
Glucose, UA: NEGATIVE
Ketones, UA: NEGATIVE
Leukocytes, UA: NEGATIVE
Nitrite, UA: NEGATIVE
Odor: NEGATIVE
Protein, UA: NEGATIVE
Spec Grav, UA: 1.02 (ref 1.010–1.025)
Urobilinogen, UA: 0.2 E.U./dL
pH, UA: 6 (ref 5.0–8.0)

## 2020-06-13 NOTE — Progress Notes (Signed)
Subjective:    Patient ID: John Garcia, male    DOB: [birth year], 55 y.o.   MRN: [id number]  HPI 55 year old Male for health maintenance exam and evaluation of medical issues.  He has a history of hyperlipidemia and hypertension.  History of atrial fibrillation with rapid ventricular response in 2006.  He was hospitalized and treated with IV Cardizem and subsequently converted to sinus rhythm.  He had a cardiac cath that showed no significant coronary artery disease and has never had a further episode.  He is not on antiarrhythmic medication.  Weight is an issue.  Has chronic knee pain.  In 2014 he weighed 251 pounds.  Now weighs 300 pounds.  BMI is 36.5 today.  Has gained 10 pounds from November 2020.  He is allergic to bee stings.  History of left lower lobe pneumonia 2008.  Left hip arthroplasty by Dr. Magnus Ivan in June 2018.  History of bunions.  Reminded about screening colonoscopy.  Declines flu vaccine.  Has not been vaccinated for COVID-19.  Social history: He is divorced.  2 daughters.  Has grandchildren.  Has male John Garcia, resides with him who is a Engineer, civil (consulting) at the local hospital.  He is a Leisure centre manager.  Does not smoke.  Social alcohol consumption.  Family history: Mother with history of diabetes, hypertension, hyperlipidemia and obesity.  Father with history of coronary artery disease, melanoma, hypertension, morbid obesity, sleep apnea and lumbar spinal stenosis.  1 sister with mild hypertension and migraine headaches.    Review of Systems new complaint is itchy rash on legs He ate [place] food over the weekend containing a lot of salt.  He took blood pressure medication this morning before coming.  He is on losartan 100 mg daily.  May need to add diuretic.  To prevent Covid, he takes ivermectin every 2 weeks.    Objective:   Physical Exam Blood pressure 140/100 pulse 74 pulse oximetry 97% weight 300 pounds BMI 36.52  Skin is warm and dry.  Nodes none.  TMs are  clear.  Neck is supple without JVD thyromegaly or carotid bruits.  Chest is clear to auscultation without rales or wheezing.  Cardiac exam: Regular rate and rhythm normal S1 and S2 without murmurs or gallops.  Abdomen obese soft nondistended without hepatosplenomegaly masses or tenderness.  Prostate is normal.  No lower extremity pitting edema.  He has what appears to be a folliculitis of his lower extremities with erythema about the hair follicles.  This is an itchy rash.  Neuro: Intact without focal deficits.  Affect felt judgment are normal.       Assessment & Plan:  BMI 36.52-has gained 10 pounds in the last year.  Obesity runs in his family.  It is hard for him to exercise with his work schedule.  He is physically active.  History of left hip arthroplasty by Dr. Magnus Ivan June 2018  Insect sting allergy  Essential hypertension-blood pressure elevated today and may need additional antihypertensive medication.  He is to call me in a few days with another blood pressure reading.  He is currently on losartan 100 mg daily.  Hyperlipidemia-has not been taking lipid-lowering medication.  Fasting lipid panel drawn today.  Folliculitis of legs-have given him a prescription for triamcinolone cream 0.1% 60 g avoidances of Eucerin cream to apply to legs twice daily with refills.  Plan: He will need to return in 6 months or sooner if blood pressure is not under good control.  Likely will need  some type of lipid-lowering medication but did not like Crestor 10 mg daily.  He thought it caused arthralgias.  We could try Zocor or Lipitor.  Zetia would not likely work as well.  In November 2020 his LDL was 111, total cholesterol 181, HDL 44 triglycerides 143.

## 2020-06-13 NOTE — Patient Instructions (Signed)
It was a pleasure to see you today.  Labs drawn and pending.  Called me with blood pressure readings a few days.  Use cream on your legs up to twice daily as needed for itching.

## 2020-06-14 LAB — LIPID PANEL
Cholesterol: 267 mg/dL — ABNORMAL HIGH (ref <200)
HDL: 49 mg/dL (ref >=40)
LDL Cholesterol (Calc): 187 mg/dL (calc) — ABNORMAL HIGH
Non-HDL Cholesterol (Calc): 218 mg/dL (calc) — ABNORMAL HIGH (ref <130)
Total CHOL/HDL Ratio: 5.4 (calc) — ABNORMAL HIGH (ref <5.0)
Triglycerides: 160 mg/dL — ABNORMAL HIGH (ref <150)

## 2020-06-14 LAB — CBC WITH DIFFERENTIAL/PLATELET
Absolute Monocytes: 531 cells/uL (ref 200–950)
Basophils Absolute: 32 cells/uL (ref 0–200)
Basophils Relative: 0.5 %
Eosinophils Absolute: 102 cells/uL (ref 15–500)
Eosinophils Relative: 1.6 %
HCT: 46.4 % (ref 38.5–50.0)
Hemoglobin: 15.7 g/dL (ref 13.2–17.1)
Lymphs Abs: 2010 cells/uL (ref 850–3900)
MCH: 32.1 pg (ref 27.0–33.0)
MCHC: 33.8 g/dL (ref 32.0–36.0)
MCV: 94.9 fL (ref 80.0–100.0)
MPV: 10.8 fL (ref 7.5–12.5)
Monocytes Relative: 8.3 %
Neutro Abs: 3725 cells/uL (ref 1500–7800)
Neutrophils Relative %: 58.2 %
Platelets: 212 10*3/uL (ref 140–400)
RBC: 4.89 10*6/uL (ref 4.20–5.80)
RDW: 12.1 % (ref 11.0–15.0)
Total Lymphocyte: 31.4 %
WBC: 6.4 10*3/uL (ref 3.8–10.8)

## 2020-06-14 LAB — COMPLETE METABOLIC PANEL WITH GFR
AG Ratio: 1.7 (calc) (ref 1.0–2.5)
ALT: 38 U/L (ref 9–46)
AST: 18 U/L (ref 10–35)
Albumin: 4.5 g/dL (ref 3.6–5.1)
Alkaline phosphatase (APISO): 88 U/L (ref 35–144)
BUN: 20 mg/dL (ref 7–25)
CO2: 27 mmol/L (ref 20–32)
Calcium: 9.2 mg/dL (ref 8.6–10.3)
Chloride: 101 mmol/L (ref 98–110)
Creat: 0.84 mg/dL (ref 0.70–1.33)
GFR, Est African American: 116 mL/min/{1.73_m2} (ref >=60)
GFR, Est Non African American: 100 mL/min/{1.73_m2} (ref >=60)
Globulin: 2.6 g/dL (calc) (ref 1.9–3.7)
Glucose, Bld: 94 mg/dL (ref 65–99)
Potassium: 4.2 mmol/L (ref 3.5–5.3)
Sodium: 136 mmol/L (ref 135–146)
Total Bilirubin: 0.5 mg/dL (ref 0.2–1.2)
Total Protein: 7.1 g/dL (ref 6.1–8.1)

## 2020-06-14 LAB — PSA: PSA: 0.12 ng/mL (ref <=4.0)

## 2020-08-11 ENCOUNTER — Other Ambulatory Visit: Payer: Self-pay | Admitting: Orthopaedic Surgery

## 2020-11-03 ENCOUNTER — Other Ambulatory Visit: Payer: Self-pay | Admitting: Orthopaedic Surgery

## 2020-11-03 NOTE — Telephone Encounter (Signed)
ok 

## 2020-11-22 ENCOUNTER — Telehealth: Payer: Self-pay | Admitting: Internal Medicine

## 2020-11-22 DIAGNOSIS — Z23 Encounter for immunization: Secondary | ICD-10-CM | POA: Diagnosis not present

## 2020-11-22 NOTE — Telephone Encounter (Signed)
Gregroy Dombkowski [phone]  Romeo Apple called to see if he was up to date on Tetanus Shot, last one was 02/13/2011. He stepped on a nail, went in about 1/2 inch. Let him know he is due for one, and advised by Dr Lenord Fellers to go ahead and get tetanus shot and come up here to be seen or go to Urgent Care there in [facility].

## 2020-11-23 ENCOUNTER — Other Ambulatory Visit: Payer: Self-pay | Admitting: Internal Medicine

## 2020-12-12 ENCOUNTER — Ambulatory Visit: Payer: BC Managed Care – PPO | Admitting: Internal Medicine

## 2020-12-12 DIAGNOSIS — E782 Mixed hyperlipidemia: Secondary | ICD-10-CM

## 2020-12-12 DIAGNOSIS — I10 Essential (primary) hypertension: Secondary | ICD-10-CM

## 2020-12-12 DIAGNOSIS — Z131 Encounter for screening for diabetes mellitus: Secondary | ICD-10-CM

## 2020-12-28 ENCOUNTER — Telehealth: Payer: Self-pay | Admitting: Internal Medicine

## 2020-12-28 NOTE — Telephone Encounter (Signed)
LVM to CB about missed appointment and to check on blood pressure, see how it is doing. Dr Lenord Fellers wants [place] to go ahead and schedule CPE for first of year.

## 2021-02-12 ENCOUNTER — Other Ambulatory Visit: Payer: Self-pay | Admitting: Orthopaedic Surgery

## 2021-02-18 ENCOUNTER — Other Ambulatory Visit: Payer: Self-pay | Admitting: Internal Medicine

## 2021-05-20 ENCOUNTER — Telehealth: Payer: Self-pay | Admitting: Internal Medicine

## 2021-06-02 MED ORDER — LOSARTAN POTASSIUM 100 MG PO TABS: 100.0000 mg | ORAL_TABLET | Freq: Every day | ORAL | 1 refills | Status: DC

## 2021-06-02 NOTE — Addendum Note (Signed)
Addended by: Jama Flavors on: 06/02/2021 10:30 AM   Modules accepted: Orders

## 2021-06-02 NOTE — Telephone Encounter (Signed)
BP med refilled to last until appointment.

## 2021-06-02 NOTE — Telephone Encounter (Signed)
Ben called and scheduled a CPE for first available. He only has a couple of blood pressure pills.

## 2021-07-17 ENCOUNTER — Telehealth: Payer: Self-pay | Admitting: Internal Medicine

## 2021-07-17 NOTE — Telephone Encounter (Signed)
LVM that time had been changed from 2:00 to 3:00. ?

## 2021-08-19 ENCOUNTER — Other Ambulatory Visit: Payer: Self-pay | Admitting: Orthopaedic Surgery

## 2021-09-11 ENCOUNTER — Other Ambulatory Visit: Payer: BC Managed Care – PPO

## 2021-09-11 DIAGNOSIS — E782 Mixed hyperlipidemia: Secondary | ICD-10-CM

## 2021-09-11 DIAGNOSIS — I10 Essential (primary) hypertension: Secondary | ICD-10-CM

## 2021-09-11 DIAGNOSIS — Z125 Encounter for screening for malignant neoplasm of prostate: Secondary | ICD-10-CM | POA: Diagnosis not present

## 2021-09-12 LAB — CBC WITH DIFFERENTIAL/PLATELET
Absolute Monocytes: 523 cells/uL (ref 200–950)
Basophils Absolute: 40 cells/uL (ref 0–200)
Basophils Relative: 0.6 %
Eosinophils Absolute: 168 cells/uL (ref 15–500)
Eosinophils Relative: 2.5 %
HCT: 47.3 % (ref 38.5–50.0)
Hemoglobin: 16 g/dL (ref 13.2–17.1)
Lymphs Abs: 2077 cells/uL (ref 850–3900)
MCH: 32 pg (ref 27.0–33.0)
MCHC: 33.8 g/dL (ref 32.0–36.0)
MCV: 94.6 fL (ref 80.0–100.0)
MPV: 10.6 fL (ref 7.5–12.5)
Monocytes Relative: 7.8 %
Neutro Abs: 3893 cells/uL (ref 1500–7800)
Neutrophils Relative %: 58.1 %
Platelets: 199 10*3/uL (ref 140–400)
RBC: 5 10*6/uL (ref 4.20–5.80)
RDW: 12.2 % (ref 11.0–15.0)
Total Lymphocyte: 31 %
WBC: 6.7 10*3/uL (ref 3.8–10.8)

## 2021-09-12 LAB — COMPLETE METABOLIC PANEL WITH GFR
AG Ratio: 1.6 (calc) (ref 1.0–2.5)
ALT: 31 U/L (ref 9–46)
AST: 21 U/L (ref 10–35)
Albumin: 4.2 g/dL (ref 3.6–5.1)
Alkaline phosphatase (APISO): 83 U/L (ref 35–144)
BUN: 17 mg/dL (ref 7–25)
CO2: 26 mmol/L (ref 20–32)
Calcium: 9.2 mg/dL (ref 8.6–10.3)
Chloride: 103 mmol/L (ref 98–110)
Creat: 0.76 mg/dL (ref 0.70–1.30)
Globulin: 2.7 g/dL (calc) (ref 1.9–3.7)
Glucose, Bld: 93 mg/dL (ref 65–99)
Potassium: 4.3 mmol/L (ref 3.5–5.3)
Sodium: 138 mmol/L (ref 135–146)
Total Bilirubin: 0.4 mg/dL (ref 0.2–1.2)
Total Protein: 6.9 g/dL (ref 6.1–8.1)
eGFR: 107 mL/min/{1.73_m2} (ref >=60)

## 2021-09-12 LAB — LIPID PANEL
Cholesterol: 274 mg/dL — ABNORMAL HIGH (ref <200)
HDL: 52 mg/dL (ref >=40)
LDL Cholesterol (Calc): 183 mg/dL (calc) — ABNORMAL HIGH
Non-HDL Cholesterol (Calc): 222 mg/dL (calc) — ABNORMAL HIGH (ref <130)
Total CHOL/HDL Ratio: 5.3 (calc) — ABNORMAL HIGH (ref <5.0)
Triglycerides: 208 mg/dL — ABNORMAL HIGH (ref <150)

## 2021-09-12 LAB — PSA: PSA: 0.13 ng/mL (ref <=4.00)

## 2021-09-19 ENCOUNTER — Ambulatory Visit (INDEPENDENT_AMBULATORY_CARE_PROVIDER_SITE_OTHER): Payer: BC Managed Care – PPO | Admitting: Internal Medicine

## 2021-09-19 ENCOUNTER — Encounter: Payer: Self-pay | Admitting: Internal Medicine

## 2021-09-19 VITALS — BP 138/94 | HR 72 | Temp 97.4°F | Ht 75.0 in | Wt 319.5 lb

## 2021-09-19 DIAGNOSIS — I10 Essential (primary) hypertension: Secondary | ICD-10-CM

## 2021-09-19 DIAGNOSIS — Z0000 Encounter for general adult medical examination without abnormal findings: Secondary | ICD-10-CM | POA: Diagnosis not present

## 2021-09-19 DIAGNOSIS — M25561 Pain in right knee: Secondary | ICD-10-CM

## 2021-09-19 DIAGNOSIS — Z23 Encounter for immunization: Secondary | ICD-10-CM

## 2021-09-19 DIAGNOSIS — M25562 Pain in left knee: Secondary | ICD-10-CM

## 2021-09-19 DIAGNOSIS — E782 Mixed hyperlipidemia: Secondary | ICD-10-CM | POA: Diagnosis not present

## 2021-09-19 DIAGNOSIS — Z8249 Family history of ischemic heart disease and other diseases of the circulatory system: Secondary | ICD-10-CM | POA: Diagnosis not present

## 2021-09-19 DIAGNOSIS — R7301 Impaired fasting glucose: Secondary | ICD-10-CM | POA: Diagnosis not present

## 2021-09-19 DIAGNOSIS — G8929 Other chronic pain: Secondary | ICD-10-CM

## 2021-09-19 DIAGNOSIS — Z1211 Encounter for screening for malignant neoplasm of colon: Secondary | ICD-10-CM | POA: Diagnosis not present

## 2021-09-19 DIAGNOSIS — M170 Bilateral primary osteoarthritis of knee: Secondary | ICD-10-CM

## 2021-09-19 LAB — POCT URINALYSIS DIPSTICK
Bilirubin, UA: NEGATIVE
Blood, UA: NEGATIVE
Glucose, UA: NEGATIVE
Ketones, UA: NEGATIVE
Leukocytes, UA: NEGATIVE
Nitrite, UA: NEGATIVE
Protein, UA: NEGATIVE
Spec Grav, UA: 1.02 (ref 1.010–1.025)
Urobilinogen, UA: 0.2 E.U./dL
pH, UA: 6.5 (ref 5.0–8.0)

## 2021-09-19 MED ORDER — ROSUVASTATIN CALCIUM 20 MG PO TABS: 20.0000 mg | ORAL_TABLET | Freq: Every day | ORAL | 3 refills | Status: DC

## 2021-09-19 NOTE — Progress Notes (Incomplete)
? ?  Subjective:  ? ? Patient ID: John Garcia, male    DOB: 1965/08/03, 56 y.o.   MRN: [id number] ? ?HPI 56 year old Male for health maintenance exam  ? ? ? ?Review of Systems ? ?   ?Objective:  ? Physical Exam ? ? ? ? ?   ?Assessment & Plan:  ? ? ?

## 2021-09-20 LAB — HEMOGLOBIN A1C
Hgb A1c MFr Bld: 5.2 % of total Hgb (ref <5.7)
Mean Plasma Glucose: 103 mg/dL
eAG (mmol/L): 5.7 mmol/L

## 2021-10-16 ENCOUNTER — Other Ambulatory Visit: Payer: Self-pay | Admitting: Orthopaedic Surgery

## 2021-11-28 ENCOUNTER — Telehealth: Payer: Self-pay | Admitting: *Deleted

## 2021-11-28 NOTE — Telephone Encounter (Signed)
No return call received procedure and pre-visit cancelled,no show letter mailed.

## 2021-11-28 NOTE — Telephone Encounter (Signed)
Attempted to call x 2 message left with call back # to return call by 5 pm today to reschedule pre-visit or upcoming procedure on 12/19/21 will be cancelled.

## 2021-11-29 ENCOUNTER — Other Ambulatory Visit: Payer: Self-pay | Admitting: Internal Medicine

## 2021-12-19 ENCOUNTER — Encounter: Payer: BLUE CROSS/BLUE SHIELD | Admitting: Gastroenterology

## 2021-12-29 ENCOUNTER — Other Ambulatory Visit: Payer: Self-pay | Admitting: Orthopaedic Surgery

## 2022-01-08 ENCOUNTER — Ambulatory Visit (HOSPITAL_BASED_OUTPATIENT_CLINIC_OR_DEPARTMENT_OTHER)
Admission: RE | Admit: 2022-01-08 | Discharge: 2022-01-08 | Disposition: A | Discharge status: Home / Self Care | Payer: Self-pay | Source: Ambulatory Visit | Attending: Internal Medicine | Admitting: Internal Medicine

## 2022-01-08 ENCOUNTER — Encounter (HOSPITAL_BASED_OUTPATIENT_CLINIC_OR_DEPARTMENT_OTHER): Payer: Self-pay

## 2022-01-08 DIAGNOSIS — Z8249 Family history of ischemic heart disease and other diseases of the circulatory system: Secondary | ICD-10-CM | POA: Insufficient documentation

## 2022-01-09 NOTE — Progress Notes (Signed)
Called patient and placed referral

## 2022-01-16 ENCOUNTER — Telehealth: Payer: Self-pay | Admitting: Internal Medicine

## 2022-01-16 NOTE — Telephone Encounter (Signed)
John Garcia [phone]  Romeo Apple called to say he would like to try the injection on [facility], he said the pills helped some, but he only lost 4-5 lbs. So he wants try the injection or increase the pills, so he can lose weight. I let him know we do not do that, and he said you told him to call back if it worked.

## 2022-01-17 NOTE — Telephone Encounter (Signed)
Called Romeo Apple gave him the phone number for [place] [facility] [phone]

## 2022-02-05 ENCOUNTER — Encounter (HOSPITAL_BASED_OUTPATIENT_CLINIC_OR_DEPARTMENT_OTHER): Payer: Self-pay | Admitting: Cardiology

## 2022-02-05 ENCOUNTER — Ambulatory Visit (INDEPENDENT_AMBULATORY_CARE_PROVIDER_SITE_OTHER): Payer: BC Managed Care – PPO | Admitting: Cardiology

## 2022-02-05 VITALS — BP 132/80 | HR 75 | Ht 75.0 in | Wt 312.2 lb

## 2022-02-05 DIAGNOSIS — E782 Mixed hyperlipidemia: Secondary | ICD-10-CM

## 2022-02-05 DIAGNOSIS — Z7189 Other specified counseling: Secondary | ICD-10-CM

## 2022-02-05 DIAGNOSIS — I2510 Atherosclerotic heart disease of native coronary artery without angina pectoris: Secondary | ICD-10-CM | POA: Diagnosis not present

## 2022-02-05 DIAGNOSIS — Z713 Dietary counseling and surveillance: Secondary | ICD-10-CM | POA: Diagnosis not present

## 2022-02-05 DIAGNOSIS — I10 Essential (primary) hypertension: Secondary | ICD-10-CM

## 2022-02-05 DIAGNOSIS — Z7182 Exercise counseling: Secondary | ICD-10-CM

## 2022-02-05 NOTE — Progress Notes (Signed)
Cardiology Office Note:    Date:  02/05/2022   ID:  John Garcia, John Garcia 04-25-1966, MRN [id number]  PCP:  John Mackintosh, MD  Cardiologist:  John Red, MD  Referring MD: John Mackintosh, MD   CC: new patient evaluation for abnormal calcium score  History of Present Illness:    John Garcia is a 56 y.o. male with a hx of hypertension, atrial flutter with rapid ventricular response, hyperlipidemia, GERD, obesity, who is seen as a new consult at the request of John Mackintosh, MD for the evaluation and management of abnormal calcium score.  Today: Note from John Garcia from 09/19/21 reviewed. Calcium score ordered for risk factors and family history of heart disease. Calcium score returned very elevated at 1192, 99th percentile. Referred to cardiology for further evaluation.  He is accompanied by his girlfriend. He appears to be doing well.   Occasionally, he will complain of muscle spasms, but denies chest pain.   His daily activities mainly involve operating machinery. He states that he does not walk or perform much formal exercise because his knees hurt. He does not have issues with shortness of breath or chest pain when walking; he will usually stop because of his knee pain. He states that walking up hills can be a challenge, however.  In regards to his diet, he eats a lot of beef. He does not eat many fried foods unless he goes out to eat, which is only about once a week or less. He likes eating cheese. He does not eat a lot of sweet things. He does tend to eat late at night because of the odd hours he works. He drinks a fair few Dr Reino Kent cans, but not many other sodas.  He usually drinks alcohol about every day.  He denies any palpitations, chest pain, shortness of breath, or peripheral edema. No lightheadedness, headaches, syncope, orthopnea, or PND.  Past Medical History:  Diagnosis Date   Arthritis    Atrial flutter with rapid ventricular response (HCC)     GERD (gastroesophageal reflux disease)    History of hiatal hernia    Hyperlipidemia    Hypertension    Obesity     Past Surgical History:  Procedure Laterality Date   TOTAL HIP ARTHROPLASTY Left 10/16/2016   Procedure: LEFT TOTAL HIP ARTHROPLASTY ANTERIOR APPROACH;  Surgeon: Kathryne Hitch, MD;  Location: [facility] OR;  Service: Orthopedics;  Laterality: Left;    Current Medications: Current Outpatient Medications on File Prior to Visit  Medication Sig   aspirin 81 MG chewable tablet CHEW 1 TABLET BY MOUTH 2 TIMES DAILY AFTER A MEAL   cetirizine (ZYRTEC) 10 MG tablet Take 10 mg by mouth daily as needed for allergies.   diclofenac (VOLTAREN) 75 MG EC tablet TAKE 1 TABLET BY MOUTH TWICE A DAY   diclofenac sodium (VOLTAREN) 1 % GEL Apply 2-4 g topically 4 (four) times daily.   losartan (COZAAR) 100 MG tablet TAKE 1 TABLET BY MOUTH EVERY DAY   Multiple Vitamin (MULTIVITAMIN WITH MINERALS) TABS tablet Take 1 tablet by mouth daily.   RaNITidine HCl (ZANTAC PO) Take 1 tablet by mouth daily as needed (heartburn).   rosuvastatin (CRESTOR) 20 MG tablet Take 1 tablet (20 mg total) by mouth daily.   No current facility-administered medications on file prior to visit.     Allergies:   Bee venom, Ivp dye [iodinated contrast media], and Shellfish allergy   Social History   Tobacco Use   Smoking  status: Never   Smokeless tobacco: Former    Quit date: 10/12/2006  Substance Use Topics   Alcohol use: Yes    Comment: coke and jack daniels 4x a week (couple oz.)   Drug use: No    Family History: family history includes Hypertension in his mother. Mother has diabetes. Sister has hypertension. Maternal grandfather had irregular heart beat.   ROS:   Please see the history of present illness.  Additional pertinent ROS: Constitutional: Negative for chills, fever, night sweats, unintentional weight loss  HENT: Negative for ear pain and hearing loss.   Eyes: Negative for loss of vision and eye  pain.  Respiratory: Negative for cough, sputum, wheezing.   Cardiovascular: See HPI. Gastrointestinal: Negative for abdominal pain, melena, and hematochezia.  Genitourinary: Negative for dysuria and hematuria.  Musculoskeletal: Negative for falls and myalgias.  Skin: Negative for itching and rash.  Neurological: Negative for focal weakness, focal sensory changes and loss of consciousness.  Endo/Heme/Allergies: Does not bruise/bleed easily.     EKGs/Labs/Other Studies Reviewed:    The following studies were reviewed today:  CT Cardiac Score 01/08/2022:  Ascending Aorta: Normal caliber. No calcifications.   Aortic Valve Calcium score: 0   Mitral annular calcification: 0   Pericardium: Normal.   Coronary arteries: Normal origins.   Coronary Calcium Score:   Left main: 18   Left anterior descending artery: 488   Left circumflex artery: 393   Right coronary artery: 292   Total: 1192   Percentile: 99th for age, sex, and race matched control.   IMPRESSION: 1. Coronary calcium score of 1192. This was 99th percentile for age, gender, and race matched controls.  EKG:  EKG is personally reviewed.   02/05/22: NSR at 75 bpm  Recent Labs: 09/11/2021: ALT 31; BUN 17; Creat 0.76; Hemoglobin 16.0; Platelets 199; Potassium 4.3; Sodium 138  Recent Lipid Panel    Component Value Date/Time   CHOL 274 (H) 09/11/2021 0935   TRIG 208 (H) 09/11/2021 0935   HDL 52 09/11/2021 0935   CHOLHDL 5.3 (H) 09/11/2021 0935   VLDL 20 08/07/2016 1029   LDLCALC 183 (H) 09/11/2021 0935    Physical Exam:    VS:  BP 132/80 (BP Location: Left Arm, Patient Position: Sitting, Cuff Size: Large)   Pulse 75   Ht 6\' 3"  (1.905 m)   Wt (!) 312 lb 3.2 oz (141.6 kg)   BMI 39.02 kg/m     Wt Readings from Last 3 Encounters:  02/05/22 (!) 312 lb 3.2 oz (141.6 kg)  09/19/21 (!) 319 lb 8 oz (144.9 kg)  06/13/20 300 lb (136.1 kg)    GEN: Well nourished, well developed in no acute distress HEENT: Normal,  moist mucous membranes NECK: No JVD CARDIAC: regular rhythm, normal S1 and S2, no rubs or gallops. No murmur. VASCULAR: Radial and DP pulses 2+ bilaterally. No carotid bruits RESPIRATORY:  Clear to auscultation without rales, wheezing or rhonchi  ABDOMEN: Soft, non-tender, non-distended MUSCULOSKELETAL:  Ambulates independently SKIN: Warm and dry, no edema NEUROLOGIC:  Alert and oriented x 3. No focal neuro deficits noted. PSYCHIATRIC:  Normal affect    ASSESSMENT:    1. Nonocclusive coronary atherosclerosis of native coronary artery   2. Coronary artery calcification seen on CT scan   3. Mixed hyperlipidemia   4. Cardiac risk counseling   5. Counseling on health promotion and disease prevention   6. Nutritional counseling   7. Exercise counseling   8. Essential hypertension    PLAN:  Abnormal coronary calcium score, consistent with nonobstrucitve CAD Mixed hyperlipidemia Family history of heart disease -We reviewed the calcium score at length, including actual images as well as the graph showing mortality based on calcium score. We discussed the pathophysiology of cholesterol plaque formation, the role of calcium and why it is a marker, how plaque is key to acute MI/CVA, and how known plaque is managed with medications.   -given the very high score, concern that he may have silent ischemia. Will order treadmill stress -discussed lifestyle at length today -continue aspirin, rosuvastatin  Shared Decision Making/Informed Consent The risks [chest pain, shortness of breath, cardiac arrhythmias, dizziness, blood pressure fluctuations, myocardial infarction, stroke/transient ischemic attack, and life-threatening complications (estimated to be 1 in 10,000)], benefits (risk stratification, diagnosing coronary artery disease, treatment guidance) and alternatives of an exercise tolerance test were discussed in detail with Mr. Scarlata and he agrees to proceed.   Hypertension -continue  losartan  Cardiac risk counseling and prevention recommendations: -recommend heart healthy/Mediterranean diet, with whole grains, fruits, vegetable, fish, lean meats, nuts, and olive oil. Limit salt. -recommend moderate walking, 3-5 times/week for 30-50 minutes each session. Aim for at least 150 minutes.week. Goal should be pace of 3 miles/hours, or walking 1.5 miles in 30 minutes -recommend avoidance of tobacco products. Avoid excess alcohol. -ASCVD risk score: The 10-year ASCVD risk score (Arnett DK, et al., 2019) is: 9.5%   Values used to calculate the score:     Age: 60 years     Sex: Male     Is Non-Hispanic African American: No     Diabetic: No     Tobacco smoker: No     Systolic Blood Pressure: 093 mmHg     Is BP treated: Yes     HDL Cholesterol: 52 mg/dL     Total Cholesterol: 274 mg/dL    Plan for follow up: 6 months.  Buford Dresser, MD, PhD, [facility]    Medication Adjustments/Labs and Tests Ordered: Current medicines are reviewed at length with the patient today.  Concerns regarding medicines are outlined above.  Orders Placed This Encounter  Procedures   Cardiac Stress Test: Informed Consent Details: Physician/Practitioner Attestation; Transcribe to consent form and obtain patient signature   EXERCISE TOLERANCE TEST (ETT)   EKG 12-Lead   No orders of the defined types were placed in this encounter.  Patient Instructions  Medication Instructions:  Your Physician recommend you continue on your current medication as directed.    *If you need a refill on your cardiac medications before your next appointment, please call your pharmacy*   Lab Work: None ordered today   Testing/Procedures: Your physician has requested that you have an exercise tolerance test. For further information please visit [email or web address]. Please also follow instruction sheet, as given. [place]   Follow-Up: At [facility], you and your health needs are our priority.  As part of our continuing mission to provide you with exceptional heart care, we have created designated Provider Care Teams.  These Care Teams include your primary Cardiologist (physician) and Advanced Practice Providers (APPs -  Physician Assistants and Nurse Practitioners) who all work together to provide you with the care you need, when you need it.  We recommend signing up for the patient portal called "MyChart".  Sign up information is provided on this After Visit Summary.  MyChart is used to connect with patients for Virtual Visits (Telemedicine).  Patients are able to view lab/test  results, encounter notes, upcoming appointments, etc.  Non-urgent messages can be sent to your provider as well.   To learn more about what you can do with MyChart, go to [email or web address].    Your next appointment:   6 month(s)  The format for your next appointment:   In Person  Provider:   Jodelle Red, MD      [facility] at [facility] [address] [place], [place] [address] Phone:  [phone]        You are scheduled for an Exercise Stress Test  Please arrive 15 minutes prior to your appointment time for registration and insurance purposes.  The test will take approximately 45 minutes to complete.  How to prepare for your Exercise Stress Test: Do bring a list of your current medications with you.  If not listed below, you may take your medications as normal. Do wear comfortable clothes (no dresses or overalls) and walking shoes, tennis shoes preferred (no heels or open toed shoes are allowed) Do Not wear cologne, perfume, aftershave or lotions (deodorant is allowed). Please report to [address] [facility], [address] for your test.  If these instructions are not followed, your test will have to be rescheduled.  If you have questions or concerns about your appointment, you can  call the Stress Lab at [phone].  If you cannot keep your appointment, please provide 24 hours notification to the Stress Lab, to avoid a possible $50 charge to your account           I,Breanna Adamick,acting as a scribe for John Red, MD.,have documented all relevant documentation on the behalf of John Red, MD,as directed by  John Red, MD while in the presence of John Red, MD.  I, John Red, MD, have reviewed all documentation for this visit. The documentation on 02/25/22 for the exam, diagnosis, procedures, and orders are all accurate and complete.   Signed, John Red, MD PhD 02/05/2022     [facility]

## 2022-02-05 NOTE — Patient Instructions (Signed)
Medication Instructions:  Your Physician recommend you continue on your current medication as directed.    *If you need a refill on your cardiac medications before your next appointment, please call your pharmacy*   Lab Work: None ordered today   Testing/Procedures: Your physician has requested that you have an exercise tolerance test. For further information please visit [email or web address]. Please also follow instruction sheet, as given. [place]   Follow-Up: At [facility], you and your health needs are our priority.  As part of our continuing mission to provide you with exceptional heart care, we have created designated Provider Care Teams.  These Care Teams include your primary Cardiologist (physician) and Advanced Practice Providers (APPs -  Physician Assistants and Nurse Practitioners) who all work together to provide you with the care you need, when you need it.  We recommend signing up for the patient portal called "MyChart".  Sign up information is provided on this After Visit Summary.  MyChart is used to connect with patients for Virtual Visits (Telemedicine).  Patients are able to view lab/test results, encounter notes, upcoming appointments, etc.  Non-urgent messages can be sent to your provider as well.   To learn more about what you can do with MyChart, go to [email or web address].    Your next appointment:   6 month(s)  The format for your next appointment:   In Person  Provider:   Buford Dresser, MD      [facility] at [facility] [address], [place], [place] [address] Phone:  [phone]        You are scheduled for an Exercise Stress Test  Please arrive 15 minutes prior to your appointment time for registration and insurance purposes.  The test will take approximately 45 minutes to complete.  How to prepare for your Exercise Stress Test: Do bring a list of your  current medications with you.  If not listed below, you may take your medications as normal. Do wear comfortable clothes (no dresses or overalls) and walking shoes, tennis shoes preferred (no heels or open toed shoes are allowed) Do Not wear cologne, perfume, aftershave or lotions (deodorant is allowed). Please report to [place], [address] for your test.  If these instructions are not followed, your test will have to be rescheduled.  If you have questions or concerns about your appointment, you can call the Stress Lab at [phone].  If you cannot keep your appointment, please provide 24 hours notification to the Stress Lab, to avoid a possible $50 charge to your account

## 2022-02-06 ENCOUNTER — Telehealth: Payer: Self-pay | Admitting: Licensed Clinical Social Worker

## 2022-02-07 NOTE — Telephone Encounter (Signed)
H&V Care Navigation CSW Progress Note  Clinical Social Worker contacted patient by phone to f/u after appt for screening and insurance review. LCSW was able to reach pt at [phone], introduced self, role, reason for call. Pt shares that he does have insurance currently under [employer], he did not bring card but provides information as:  Group #[id number] ID #[id number]  Will make sure this is added and encouraged pt to call and let billing know if any issues occur.   No additional questions/concerns noted at this time.   Patient is participating in a Managed Medicaid Plan:  No, BCBS  SDOH Screenings   Depression (PHQ2-9): Low Risk  (09/19/2021)  Tobacco Use: Medium Risk (02/05/2022)   John Garcia, MSW, [place]  [phone]- work cell phone (preferred) [phone]- desk phone

## 2022-02-16 ENCOUNTER — Telehealth (HOSPITAL_COMMUNITY): Payer: Self-pay | Admitting: *Deleted

## 2022-02-16 NOTE — Telephone Encounter (Signed)
Close encounter 

## 2022-02-20 ENCOUNTER — Ambulatory Visit (HOSPITAL_COMMUNITY)
Admission: RE | Admit: 2022-02-20 | Discharge: 2022-02-20 | Disposition: A | Discharge status: Home / Self Care | Payer: BC Managed Care – PPO | Source: Ambulatory Visit | Attending: Cardiology | Admitting: Cardiology

## 2022-02-20 DIAGNOSIS — I2510 Atherosclerotic heart disease of native coronary artery without angina pectoris: Secondary | ICD-10-CM | POA: Insufficient documentation

## 2022-02-21 LAB — EXERCISE TOLERANCE TEST
Angina Index: 0
Duke Treadmill Score: 7
Estimated workload: 8.5
Exercise duration (min): 7 min
Exercise duration (sec): 1 s
MPHR: 166 {beats}/min
Peak HR: 146 {beats}/min
Percent HR: 87 %
Rest HR: 73 {beats}/min
ST Depression (mm): 0 mm

## 2022-02-25 ENCOUNTER — Encounter (HOSPITAL_BASED_OUTPATIENT_CLINIC_OR_DEPARTMENT_OTHER): Payer: Self-pay | Admitting: Cardiology

## 2022-03-12 ENCOUNTER — Other Ambulatory Visit: Payer: BC Managed Care – PPO

## 2022-03-12 DIAGNOSIS — E782 Mixed hyperlipidemia: Secondary | ICD-10-CM

## 2022-03-12 DIAGNOSIS — R7301 Impaired fasting glucose: Secondary | ICD-10-CM

## 2022-03-12 DIAGNOSIS — E7800 Pure hypercholesterolemia, unspecified: Secondary | ICD-10-CM

## 2022-03-19 ENCOUNTER — Ambulatory Visit: Payer: BC Managed Care – PPO | Admitting: Internal Medicine

## 2022-03-27 ENCOUNTER — Other Ambulatory Visit: Payer: Self-pay | Admitting: Orthopaedic Surgery

## 2022-04-12 ENCOUNTER — Telehealth: Payer: Self-pay | Admitting: Orthopaedic Surgery

## 2022-04-12 ENCOUNTER — Other Ambulatory Visit: Payer: Self-pay | Admitting: Physician Assistant

## 2022-04-12 MED ORDER — DICLOFENAC SODIUM 75 MG PO TBEC: 75.0000 mg | DELAYED_RELEASE_TABLET | Freq: Two times a day (BID) | ORAL | 1 refills | Status: DC

## 2022-04-12 NOTE — Telephone Encounter (Signed)
Patient called in requesting refill on Voltaren please advise

## 2022-06-20 ENCOUNTER — Other Ambulatory Visit: Payer: Self-pay | Admitting: Physician Assistant

## 2022-07-04 ENCOUNTER — Telehealth: Payer: Self-pay | Admitting: Physician Assistant

## 2022-07-04 ENCOUNTER — Other Ambulatory Visit: Payer: Self-pay | Admitting: Physician Assistant

## 2022-07-04 NOTE — Telephone Encounter (Signed)
I called pt, he stated understanding but wanted to ask for one more refill. States he is about to go out of town and will come in to be seen after his trip. Please advise

## 2022-07-04 NOTE — Telephone Encounter (Signed)
Rx message already sent to you. Pt hasn't been seen since 2020. Please advise

## 2022-07-04 NOTE — Telephone Encounter (Signed)
Patient called needing Rx refilled Diclofenac. Patient uses [facility] in [place]. The number to contact patient is [phone]

## 2022-07-05 NOTE — Telephone Encounter (Signed)
Lvm advising pt.

## 2022-08-06 ENCOUNTER — Ambulatory Visit (HOSPITAL_BASED_OUTPATIENT_CLINIC_OR_DEPARTMENT_OTHER): Payer: Self-pay | Admitting: Cardiology

## 2022-09-11 NOTE — Progress Notes (Addendum)
Patient Care Team: Margaree Mackintosh, MD as PCP - General (Internal Medicine) Jodelle Red, MD as PCP - Cardiology (Cardiology)  Visit Date: 09/11/22  Subjective:    Patient ID: John Garcia , Male   DOB: [birth year], 57 y.o.    MRN: [id number]   57 y.o. Male presents today for health exam and evaluation of medical issues.  Patient has a past medical history of arthritis, atrial flutter with rapid ventricular response, GERD, hiatal hernia, hyperlipidemia, hypertension, obesity.   History of hypertension treated with losartan 100 mg daily.  History of hyperlipidemia treated with rosuvastatin 20 mg daily.  He has a tree of atrial fibrillation with RVR in 2006.  He was hospitalized and treated with IV Cardizem and subsequently converted to sinus rhythm.  He had a cardiac cath that showed no significant coronary artery disease and has never had a further episode and is not on any antiarrhythmic medication.  Weight continues to be an issue.  Not able to exercise much due to chronic knee pain.  In 2020, he weighed 290 pounds with a BMI of 35.30.  This is improved to 281 pounds 1.9 ounces with a BMI of 35.14.  He is not interested in weight loss surgery.  He is allergic to bee stings.  History of left lower lobe pneumonia in 2008.  Left hip arthroplasty by Dr. Magnus Ivan in June 2018.  Social history: He is divorced but has a male partner who resides with him.  He has 2 adult daughters and grandchildren.  He is a Leisure centre manager.  He does not smoke.  Social alcohol consumption.  Family history: Mother with history of diabetes, hypertension, hyperlipidemia and obesity.  Father with history of coronary artery disease, melanoma, hypertension, morbid obesity, sleep apnea, lumbar spinal stenosis and dementia passed away a few months ago.  1 sister with mild hypertension and migraine headaches.   Patient says he has never had COVID-19 virus infection.  He did have a febrile illness  with a  cough after a trip to [facility] in December 2019  Needs screening colonoscopy and this was mentioned.  Given tetanus immunization update in May 2023.  Past Medical History:  Diagnosis Date   Arthritis    Atrial flutter with rapid ventricular response (HCC)    GERD (gastroesophageal reflux disease)    History of hiatal hernia    Hyperlipidemia    Hypertension    Obesity      Family History  Problem Relation Age of Onset   Hypertension Mother     Social WU:[id number]. Has significant other, Dorisann Frames. Has 2 adult daughters. Non-smoker  Declines Hep C and HIV screening  Colonoscopy discussed. Is interested in Cologard but has never had a colonoscopy   ROS no new complaints. Has busy lifestyle as a cattleman. Travels a lot for work- not much physical exercise      Objective:   Vitals: Blood pressure 98/72, pulse 81 regular, temperature 98.1 degrees, pulse oximetry 98% on room air weight 281 pounds 1.9 ounces BMI 35.14   Physical Exam Skin: Warm and dry.  No cervical adenopathy, thyromegaly or carotid bruits.  Chest clear.  Cardiac exam: Regular rate and rhythm without murmur or ectopy.  No lower extremity pitting edema.  Abdomen: No hepatosplenomegaly masses or tenderness.  Prostate is normal without nodules.  Affect thought and judgment are normal.   Results:   Studies obtained and personally reviewed by me:  Imaging, colonoscopy, mammogram, bone density scan, echocardiogram, heart cath,  stress test, CT calcium score, etc.    Labs:       Component Value Date/Time   NA 138 09/11/2021 0935   K 4.3 09/11/2021 0935   CL 103 09/11/2021 0935   CO2 26 09/11/2021 0935   GLUCOSE 93 09/11/2021 0935   BUN 17 09/11/2021 0935   CREATININE 0.76 09/11/2021 0935   CALCIUM 9.2 09/11/2021 0935   PROT 6.9 09/11/2021 0935   ALBUMIN 3.9 08/07/2016 1029   AST 21 09/11/2021 0935   ALT 31 09/11/2021 0935   ALKPHOS 84 08/07/2016 1029   BILITOT 0.4 09/11/2021 0935   GFRNONAA 100  06/13/2020 1121   GFRAA 116 06/13/2020 1121     Lab Results  Component Value Date   WBC 6.7 09/11/2021   HGB 16.0 09/11/2021   HCT 47.3 09/11/2021   MCV 94.6 09/11/2021   PLT 199 09/11/2021    Lab Results  Component Value Date   CHOL 274 (H) 09/11/2021   HDL 52 09/11/2021   LDLCALC 183 (H) 09/11/2021   TRIG 208 (H) 09/11/2021   CHOLHDL 5.3 (H) 09/11/2021    Lab Results  Component Value Date   HGBA1C 5.2 09/19/2021     Lab Results  Component Value Date   TSH 1.53 01/27/2016     Lab Results  Component Value Date   PSA 0.13 09/11/2021   PSA 0.12 06/13/2020   PSA 0.1 03/24/2019         Assessment & Plan:   Essential hypertension stable on losartan 100 mg daily  Musculoskeletal pain-prescribed Voltaren 75 mg with a meal once daily  Mixed hyperlipidemia-treated with Crestor 20 mg daily and lipid panel is completely normal representing a significant improvement from lipid panel drawn in May 2023 at which time he had triglycerides of 208, cholesterol 274, LDL cholesterol of 183  BMI 35.14-continue with diet and exercise efforts  Plan: Health maintenance exam and fasting labs due in 6 months.     I,Alexander Ruley,acting as a Neurosurgeon for Margaree Mackintosh, MD.,have documented all relevant documentation on the behalf of Margaree Mackintosh, MD,as directed by  Margaree Mackintosh, MD while in the presence of Margaree Mackintosh, MD.   I, Margaree Mackintosh, MD, have reviewed all documentation for this visit. The documentation on 09/18/22 for the exam, diagnosis, procedures, and orders are all accurate and complete.

## 2022-09-14 ENCOUNTER — Other Ambulatory Visit: Payer: Self-pay | Admitting: Internal Medicine

## 2022-09-17 ENCOUNTER — Other Ambulatory Visit: Payer: BC Managed Care – PPO

## 2022-09-17 DIAGNOSIS — E782 Mixed hyperlipidemia: Secondary | ICD-10-CM

## 2022-09-17 DIAGNOSIS — R7301 Impaired fasting glucose: Secondary | ICD-10-CM | POA: Diagnosis not present

## 2022-09-18 ENCOUNTER — Encounter: Payer: Self-pay | Admitting: Internal Medicine

## 2022-09-18 ENCOUNTER — Ambulatory Visit: Payer: BC Managed Care – PPO | Admitting: Internal Medicine

## 2022-09-18 VITALS — BP 98/72 | HR 81 | Temp 98.1°F | Ht 75.0 in | Wt 281.1 lb

## 2022-09-18 DIAGNOSIS — M170 Bilateral primary osteoarthritis of knee: Secondary | ICD-10-CM

## 2022-09-18 DIAGNOSIS — Z8249 Family history of ischemic heart disease and other diseases of the circulatory system: Secondary | ICD-10-CM

## 2022-09-18 DIAGNOSIS — I10 Essential (primary) hypertension: Secondary | ICD-10-CM | POA: Diagnosis not present

## 2022-09-18 DIAGNOSIS — Z0000 Encounter for general adult medical examination without abnormal findings: Secondary | ICD-10-CM | POA: Diagnosis not present

## 2022-09-18 DIAGNOSIS — M25561 Pain in right knee: Secondary | ICD-10-CM

## 2022-09-18 DIAGNOSIS — G8929 Other chronic pain: Secondary | ICD-10-CM

## 2022-09-18 DIAGNOSIS — E782 Mixed hyperlipidemia: Secondary | ICD-10-CM

## 2022-09-18 DIAGNOSIS — M25562 Pain in left knee: Secondary | ICD-10-CM

## 2022-09-18 LAB — HEPATIC FUNCTION PANEL
AG Ratio: 1.9 (calc) (ref 1.0–2.5)
ALT: 17 U/L (ref 9–46)
AST: 14 U/L (ref 10–35)
Albumin: 4.5 g/dL (ref 3.6–5.1)
Alkaline phosphatase (APISO): 98 U/L (ref 35–144)
Bilirubin, Direct: 0.1 mg/dL (ref 0.0–0.2)
Globulin: 2.4 g/dL (calc) (ref 1.9–3.7)
Indirect Bilirubin: 0.3 mg/dL (calc) (ref 0.2–1.2)
Total Bilirubin: 0.4 mg/dL (ref 0.2–1.2)
Total Protein: 6.9 g/dL (ref 6.1–8.1)

## 2022-09-18 LAB — LIPID PANEL
Cholesterol: 147 mg/dL (ref <200)
HDL: 55 mg/dL (ref >=40)
LDL Cholesterol (Calc): 70 mg/dL (calc)
Non-HDL Cholesterol (Calc): 92 mg/dL (calc) (ref <130)
Total CHOL/HDL Ratio: 2.7 (calc) (ref <5.0)
Triglycerides: 140 mg/dL (ref <150)

## 2022-09-18 LAB — HEMOGLOBIN A1C
Hgb A1c MFr Bld: 5.2 % of total Hgb (ref <5.7)
Mean Plasma Glucose: 103 mg/dL
eAG (mmol/L): 5.7 mmol/L

## 2022-09-18 MED ORDER — DICLOFENAC SODIUM 75 MG PO TBEC: 75.0000 mg | DELAYED_RELEASE_TABLET | Freq: Every day | ORAL | 1 refills | Status: DC

## 2022-10-12 NOTE — Patient Instructions (Signed)
It was a pleasure to see you today.  Please continue with losartan 100 mg daily.  Prescribed Voltaren for musculoskeletal pain.  Lipid panel is markedly improved from last year on generic Crestor 20 mg daily.  Please continue with diet exercise and weight loss efforts.  Please return in 6 months for health maintenance exam and fasting labs.

## 2022-11-08 DIAGNOSIS — S301XXA Contusion of abdominal wall, initial encounter: Secondary | ICD-10-CM | POA: Diagnosis not present

## 2022-11-08 DIAGNOSIS — S3991XA Unspecified injury of abdomen, initial encounter: Secondary | ICD-10-CM | POA: Diagnosis not present

## 2022-11-08 DIAGNOSIS — S3992XA Unspecified injury of lower back, initial encounter: Secondary | ICD-10-CM | POA: Diagnosis not present

## 2022-11-08 DIAGNOSIS — Z79899 Other long term (current) drug therapy: Secondary | ICD-10-CM | POA: Diagnosis not present

## 2022-11-08 DIAGNOSIS — S299XXA Unspecified injury of thorax, initial encounter: Secondary | ICD-10-CM | POA: Diagnosis not present

## 2022-11-08 DIAGNOSIS — I10 Essential (primary) hypertension: Secondary | ICD-10-CM | POA: Diagnosis not present

## 2022-11-08 DIAGNOSIS — I480 Paroxysmal atrial fibrillation: Secondary | ICD-10-CM | POA: Diagnosis not present

## 2022-11-08 DIAGNOSIS — Z7982 Long term (current) use of aspirin: Secondary | ICD-10-CM | POA: Diagnosis not present

## 2022-11-08 DIAGNOSIS — S29009A Unspecified injury of muscle and tendon of unspecified wall of thorax, initial encounter: Secondary | ICD-10-CM | POA: Diagnosis not present

## 2022-11-08 DIAGNOSIS — S8012XA Contusion of left lower leg, initial encounter: Secondary | ICD-10-CM | POA: Diagnosis not present

## 2022-11-08 DIAGNOSIS — Z91013 Allergy to seafood: Secondary | ICD-10-CM | POA: Diagnosis not present

## 2022-11-08 DIAGNOSIS — W5589XA Other contact with other mammals, initial encounter: Secondary | ICD-10-CM | POA: Diagnosis not present

## 2022-11-08 DIAGNOSIS — Z87891 Personal history of nicotine dependence: Secondary | ICD-10-CM | POA: Diagnosis not present

## 2022-11-08 DIAGNOSIS — R109 Unspecified abdominal pain: Secondary | ICD-10-CM | POA: Diagnosis not present

## 2022-11-08 DIAGNOSIS — Z96642 Presence of left artificial hip joint: Secondary | ICD-10-CM | POA: Diagnosis not present

## 2022-11-08 DIAGNOSIS — S7012XA Contusion of left thigh, initial encounter: Secondary | ICD-10-CM | POA: Diagnosis not present

## 2022-11-09 ENCOUNTER — Other Ambulatory Visit: Payer: Self-pay | Admitting: Internal Medicine

## 2022-11-16 DIAGNOSIS — S3991XA Unspecified injury of abdomen, initial encounter: Secondary | ICD-10-CM | POA: Diagnosis not present

## 2022-12-27 ENCOUNTER — Other Ambulatory Visit: Payer: Self-pay | Admitting: Internal Medicine

## 2023-02-03 ENCOUNTER — Other Ambulatory Visit: Payer: Self-pay | Admitting: Internal Medicine

## 2023-02-15 ENCOUNTER — Other Ambulatory Visit: Payer: Self-pay | Admitting: Internal Medicine

## 2023-02-15 DIAGNOSIS — Z1211 Encounter for screening for malignant neoplasm of colon: Secondary | ICD-10-CM

## 2023-02-15 DIAGNOSIS — Z1212 Encounter for screening for malignant neoplasm of rectum: Secondary | ICD-10-CM

## 2023-02-20 DIAGNOSIS — M25561 Pain in right knee: Secondary | ICD-10-CM | POA: Diagnosis not present

## 2023-02-20 DIAGNOSIS — M25562 Pain in left knee: Secondary | ICD-10-CM | POA: Diagnosis not present

## 2023-02-20 DIAGNOSIS — M170 Bilateral primary osteoarthritis of knee: Secondary | ICD-10-CM | POA: Diagnosis not present

## 2023-03-11 ENCOUNTER — Ambulatory Visit: Payer: BC Managed Care – PPO | Admitting: Internal Medicine
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
## 2023-03-18 ENCOUNTER — Other Ambulatory Visit: Payer: BC Managed Care – PPO

## 2023-03-18 DIAGNOSIS — E782 Mixed hyperlipidemia: Secondary | ICD-10-CM | POA: Diagnosis not present

## 2023-03-18 DIAGNOSIS — Z125 Encounter for screening for malignant neoplasm of prostate: Secondary | ICD-10-CM

## 2023-03-18 DIAGNOSIS — M170 Bilateral primary osteoarthritis of knee: Secondary | ICD-10-CM

## 2023-03-18 DIAGNOSIS — I10 Essential (primary) hypertension: Secondary | ICD-10-CM | POA: Diagnosis not present

## 2023-03-18 DIAGNOSIS — Z0000 Encounter for general adult medical examination without abnormal findings: Secondary | ICD-10-CM

## 2023-03-19 ENCOUNTER — Encounter: Payer: Self-pay | Admitting: Internal Medicine

## 2023-03-19 ENCOUNTER — Ambulatory Visit (INDEPENDENT_AMBULATORY_CARE_PROVIDER_SITE_OTHER): Payer: BC Managed Care – PPO | Admitting: Internal Medicine

## 2023-03-19 VITALS — BP 110/80 | HR 79 | Ht 75.0 in | Wt 276.0 lb

## 2023-03-19 DIAGNOSIS — M25561 Pain in right knee: Secondary | ICD-10-CM

## 2023-03-19 DIAGNOSIS — M170 Bilateral primary osteoarthritis of knee: Secondary | ICD-10-CM | POA: Diagnosis not present

## 2023-03-19 DIAGNOSIS — Z6834 Body mass index (BMI) 34.0-34.9, adult: Secondary | ICD-10-CM

## 2023-03-19 DIAGNOSIS — G8929 Other chronic pain: Secondary | ICD-10-CM

## 2023-03-19 DIAGNOSIS — I10 Essential (primary) hypertension: Secondary | ICD-10-CM | POA: Diagnosis not present

## 2023-03-19 DIAGNOSIS — M25562 Pain in left knee: Secondary | ICD-10-CM

## 2023-03-19 DIAGNOSIS — Z01818 Encounter for other preprocedural examination: Secondary | ICD-10-CM | POA: Diagnosis not present

## 2023-03-19 DIAGNOSIS — Z8249 Family history of ischemic heart disease and other diseases of the circulatory system: Secondary | ICD-10-CM

## 2023-03-19 DIAGNOSIS — Z0000 Encounter for general adult medical examination without abnormal findings: Secondary | ICD-10-CM

## 2023-03-19 LAB — POCT URINALYSIS DIP (CLINITEK)
Bilirubin, UA: NEGATIVE
Blood, UA: NEGATIVE
Glucose, UA: NEGATIVE mg/dL
Ketones, POC UA: NEGATIVE mg/dL
Leukocytes, UA: NEGATIVE
Nitrite, UA: NEGATIVE
POC PROTEIN,UA: NEGATIVE
Spec Grav, UA: 1.015 (ref 1.010–1.025)
Urobilinogen, UA: 0.2 U/dL
pH, UA: 6.5 (ref 5.0–8.0)

## 2023-03-19 LAB — CBC WITH DIFFERENTIAL/PLATELET
Absolute Lymphocytes: 1901 {cells}/uL (ref 850–3900)
Absolute Monocytes: 590 {cells}/uL (ref 200–950)
Basophils Absolute: 43 {cells}/uL (ref 0–200)
Basophils Relative: 0.6 %
Eosinophils Absolute: 72 {cells}/uL (ref 15–500)
Eosinophils Relative: 1 %
HCT: 47.4 % (ref 38.5–50.0)
Hemoglobin: 15.7 g/dL (ref 13.2–17.1)
MCH: 31.7 pg (ref 27.0–33.0)
MCHC: 33.1 g/dL (ref 32.0–36.0)
MCV: 95.6 fL (ref 80.0–100.0)
MPV: 10.4 fL (ref 7.5–12.5)
Monocytes Relative: 8.2 %
Neutro Abs: 4594 {cells}/uL (ref 1500–7800)
Neutrophils Relative %: 63.8 %
Platelets: 192 10*3/uL (ref 140–400)
RBC: 4.96 10*6/uL (ref 4.20–5.80)
RDW: 12.1 % (ref 11.0–15.0)
Total Lymphocyte: 26.4 %
WBC: 7.2 10*3/uL (ref 3.8–10.8)

## 2023-03-19 LAB — COMPLETE METABOLIC PANEL WITH GFR
AG Ratio: 1.6 (calc) (ref 1.0–2.5)
ALT: 21 U/L (ref 9–46)
AST: 14 U/L (ref 10–35)
Albumin: 4.4 g/dL (ref 3.6–5.1)
Alkaline phosphatase (APISO): 97 U/L (ref 35–144)
BUN: 15 mg/dL (ref 7–25)
CO2: 27 mmol/L (ref 20–32)
Calcium: 9.6 mg/dL (ref 8.6–10.3)
Chloride: 103 mmol/L (ref 98–110)
Creat: 0.75 mg/dL (ref 0.70–1.30)
Globulin: 2.7 g/dL (ref 1.9–3.7)
Glucose, Bld: 89 mg/dL (ref 65–99)
Potassium: 4.6 mmol/L (ref 3.5–5.3)
Sodium: 137 mmol/L (ref 135–146)
Total Bilirubin: 0.4 mg/dL (ref 0.2–1.2)
Total Protein: 7.1 g/dL (ref 6.1–8.1)
eGFR: 107 mL/min/{1.73_m2} (ref >=60)

## 2023-03-19 LAB — LIPID PANEL
Cholesterol: 165 mg/dL (ref <200)
HDL: 51 mg/dL (ref >=40)
LDL Cholesterol (Calc): 84 mg/dL
Non-HDL Cholesterol (Calc): 114 mg/dL (ref <130)
Total CHOL/HDL Ratio: 3.2 (calc) (ref <5.0)
Triglycerides: 204 mg/dL — ABNORMAL HIGH (ref <150)

## 2023-03-19 LAB — PSA: PSA: 0.19 ng/mL (ref <=4.00)

## 2023-03-19 NOTE — Patient Instructions (Addendum)
It was a pleasure to see you today. Form completed for knee arthroplasty by Dr. Christell Constant. Labs reviewed. [facility] medication such as Ozempic.Continue statin medication. Dr. Di Kindle note reviewed. EKG today is normal.Please send on Cologard specimen.

## 2023-03-20 ENCOUNTER — Telehealth: Payer: Self-pay | Admitting: Internal Medicine

## 2023-03-20 NOTE — Telephone Encounter (Signed)
Faxed surgery Clearance Form to Dr Candie Echevaria [phone], Phone [phone]  Surgery Clearance Form, EKG, Medication List, Office Note 03/19/2023, Labs, CT Cardiac Scoring

## 2023-04-16 ENCOUNTER — Other Ambulatory Visit: Payer: Self-pay | Admitting: Internal Medicine

## 2023-04-16 MED ORDER — DICLOFENAC SODIUM 75 MG PO TBEC: 75.0000 mg | DELAYED_RELEASE_TABLET | Freq: Two times a day (BID) | ORAL | 1 refills | Status: DC

## 2023-04-16 NOTE — Telephone Encounter (Signed)
Copied from CRM [id number]. Topic: Clinical - Medication Refill >> Apr 16, 2023 10:33 AM Maxwell Marion wrote: Most Recent Primary Care Visit:  Provider: Margaree Mackintosh  Department: [facility]  Visit Type: PHYSICAL 45  Date: 03/19/2023  Medication: diclofenac (VOLTAREN) 75 MG EC tablet  Has the patient contacted their pharmacy? Yes but unable to reach anyone (Agent: If no, request that the patient contact the pharmacy for the refill. If patient does not wish to contact the pharmacy document the reason why and proceed with request.) (Agent: If yes, when and what did the pharmacy advise?)  Is this the correct pharmacy for this prescription? Yes If no, delete pharmacy and type the correct one.  This is the patient's preferred pharmacy:  [id number] [place] - [address] [place] [address] Phone: [phone] Fax: [phone]   Has the prescription been filled recently?   Is the patient out of the medication?   Has the patient been seen for an appointment in the last year OR does the patient have an upcoming appointment?   Can we respond through MyChart?   Agent: Please be advised that Rx refills may take up to 3 business days. We ask that you follow-up with your pharmacy.

## 2023-05-13 DIAGNOSIS — M25561 Pain in right knee: Secondary | ICD-10-CM | POA: Diagnosis not present

## 2023-05-13 DIAGNOSIS — M1711 Unilateral primary osteoarthritis, right knee: Secondary | ICD-10-CM | POA: Diagnosis not present

## 2023-05-13 DIAGNOSIS — Z01818 Encounter for other preprocedural examination: Secondary | ICD-10-CM | POA: Diagnosis not present

## 2023-05-16 DIAGNOSIS — M1711 Unilateral primary osteoarthritis, right knee: Secondary | ICD-10-CM | POA: Diagnosis not present

## 2023-05-16 DIAGNOSIS — M25561 Pain in right knee: Secondary | ICD-10-CM | POA: Diagnosis not present

## 2023-05-17 DIAGNOSIS — M1711 Unilateral primary osteoarthritis, right knee: Secondary | ICD-10-CM | POA: Diagnosis not present

## 2023-06-25 DIAGNOSIS — Z471 Aftercare following joint replacement surgery: Secondary | ICD-10-CM | POA: Diagnosis not present

## 2023-06-25 DIAGNOSIS — Z96651 Presence of right artificial knee joint: Secondary | ICD-10-CM | POA: Diagnosis not present

## 2023-07-03 DIAGNOSIS — Z96651 Presence of right artificial knee joint: Secondary | ICD-10-CM | POA: Diagnosis not present

## 2023-07-03 DIAGNOSIS — Z471 Aftercare following joint replacement surgery: Secondary | ICD-10-CM | POA: Diagnosis not present

## 2023-07-03 DIAGNOSIS — M24661 Ankylosis, right knee: Secondary | ICD-10-CM | POA: Diagnosis not present

## 2023-07-05 DIAGNOSIS — M24661 Ankylosis, right knee: Secondary | ICD-10-CM | POA: Diagnosis not present

## 2023-07-05 DIAGNOSIS — Z96651 Presence of right artificial knee joint: Secondary | ICD-10-CM | POA: Diagnosis not present

## 2023-07-05 DIAGNOSIS — Z471 Aftercare following joint replacement surgery: Secondary | ICD-10-CM | POA: Diagnosis not present

## 2023-07-08 DIAGNOSIS — Z96651 Presence of right artificial knee joint: Secondary | ICD-10-CM | POA: Diagnosis not present

## 2023-07-08 DIAGNOSIS — M24661 Ankylosis, right knee: Secondary | ICD-10-CM | POA: Diagnosis not present

## 2023-07-08 DIAGNOSIS — Z471 Aftercare following joint replacement surgery: Secondary | ICD-10-CM | POA: Diagnosis not present

## 2023-07-10 DIAGNOSIS — M24661 Ankylosis, right knee: Secondary | ICD-10-CM | POA: Diagnosis not present

## 2023-07-10 DIAGNOSIS — Z96651 Presence of right artificial knee joint: Secondary | ICD-10-CM | POA: Diagnosis not present

## 2023-07-10 DIAGNOSIS — Z471 Aftercare following joint replacement surgery: Secondary | ICD-10-CM | POA: Diagnosis not present

## 2023-07-11 ENCOUNTER — Other Ambulatory Visit: Payer: Self-pay | Admitting: Internal Medicine

## 2023-07-12 DIAGNOSIS — Z96651 Presence of right artificial knee joint: Secondary | ICD-10-CM | POA: Diagnosis not present

## 2023-07-12 DIAGNOSIS — Z471 Aftercare following joint replacement surgery: Secondary | ICD-10-CM | POA: Diagnosis not present

## 2023-07-12 DIAGNOSIS — M24661 Ankylosis, right knee: Secondary | ICD-10-CM | POA: Diagnosis not present

## 2023-07-22 DIAGNOSIS — Z96651 Presence of right artificial knee joint: Secondary | ICD-10-CM | POA: Diagnosis not present

## 2023-07-22 DIAGNOSIS — M24661 Ankylosis, right knee: Secondary | ICD-10-CM | POA: Diagnosis not present

## 2023-07-22 DIAGNOSIS — Z471 Aftercare following joint replacement surgery: Secondary | ICD-10-CM | POA: Diagnosis not present

## 2023-07-23 DIAGNOSIS — Z96651 Presence of right artificial knee joint: Secondary | ICD-10-CM | POA: Diagnosis not present

## 2023-07-23 DIAGNOSIS — Z471 Aftercare following joint replacement surgery: Secondary | ICD-10-CM | POA: Diagnosis not present

## 2023-07-25 DIAGNOSIS — I480 Paroxysmal atrial fibrillation: Secondary | ICD-10-CM | POA: Diagnosis not present

## 2023-07-25 DIAGNOSIS — I10 Essential (primary) hypertension: Secondary | ICD-10-CM | POA: Diagnosis not present

## 2023-07-25 DIAGNOSIS — R0789 Other chest pain: Secondary | ICD-10-CM | POA: Diagnosis not present

## 2023-07-25 DIAGNOSIS — R9431 Abnormal electrocardiogram [ECG] [EKG]: Secondary | ICD-10-CM | POA: Diagnosis not present

## 2023-07-25 DIAGNOSIS — R42 Dizziness and giddiness: Secondary | ICD-10-CM | POA: Diagnosis not present

## 2023-07-25 DIAGNOSIS — R079 Chest pain, unspecified: Secondary | ICD-10-CM | POA: Diagnosis not present

## 2023-07-25 DIAGNOSIS — Z87891 Personal history of nicotine dependence: Secondary | ICD-10-CM | POA: Diagnosis not present

## 2023-07-25 DIAGNOSIS — Z7982 Long term (current) use of aspirin: Secondary | ICD-10-CM | POA: Diagnosis not present

## 2023-07-25 DIAGNOSIS — Z79899 Other long term (current) drug therapy: Secondary | ICD-10-CM | POA: Diagnosis not present

## 2023-08-21 ENCOUNTER — Other Ambulatory Visit: Payer: Self-pay | Admitting: Internal Medicine

## 2023-08-21 ENCOUNTER — Other Ambulatory Visit: Payer: Self-pay

## 2023-08-21 DIAGNOSIS — I10 Essential (primary) hypertension: Secondary | ICD-10-CM

## 2023-08-21 MED ORDER — LOSARTAN POTASSIUM 100 MG PO TABS: 100.0000 mg | ORAL_TABLET | Freq: Every day | ORAL | 1 refills | Status: DC

## 2023-08-21 NOTE — Telephone Encounter (Signed)
 Copied from CRM [id number]. Topic: Clinical - Medication Refill >> Aug 21, 2023 10:36 AM Eunice Blase wrote: Most Recent Primary Care Visit:  Provider: Margaree Mackintosh  Department: [facility]  Visit Type: PHYSICAL 45  Date: 03/19/2023  Medication: losartan (COZAAR) 100 MG tablet  Has the patient contacted their pharmacy? Yes (Agent: If no, request that the patient contact the pharmacy for the refill. If patient does not wish to contact the pharmacy document the reason why and proceed with request.) (Agent: If yes, when and what did the pharmacy advise?)  Is this the correct pharmacy for this prescription? Yes If no, delete pharmacy and type the correct one.  This is the patient's preferred pharmacy:  [id number] [place] - [address] [place] [address] Phone: [phone] Fax: [phone]   Has the prescription been filled recently? Yes  Is the patient out of the medication? Yes  Has the patient been seen for an appointment in the last year OR does the patient have an upcoming appointment? Yes  Can we respond through MyChart? Yes  Agent: Please be advised that Rx refills may take up to 3 business days. We ask that you follow-up with your pharmacy.

## 2023-09-11 NOTE — Progress Notes (Signed)
 Annual Wellness Visit   Patient Care Team: Annielee Jemmott, Jaynie Meyers, MD as PCP - General (Internal Medicine) Sheryle Donning, MD as PCP - Cardiology (Cardiology)  Visit Date: 09/24/23   Chief Complaint  Patient presents with   Annual Exam   Subjective:  Patient: John Garcia, Male DOB: [birth year], 59 y.o. MRN: [id number] John Garcia is a 58 y.o. Male who presents today for his Annual Wellness Visit. Patient has Hyperlipidemia; Essential Hypertension; Unilateral Primary Osteoarthritis, Left Hip; Status Post Total Replacement, Left Hip; and Unilateral Primary Osteoarthritis, Right Knee.  History of Hypertension treated with Losartan  100 mg daily. Blood Pressure: normotensive today at 130/80. History of Afib w/ RVR in 10-05-04 - was hospitalized and treated with IV Cardizem & rhythm subsequently converted to sinus rhythm; cardiac cath showed no significant coronary artery disease and has not had a further episode nor required medication. Says that in March he was seen in ED for Hypertension, which he believes was contributed by multiple compounding factors: use of decongestants x6 days prior, his weight, and his busy schedule over the past week.   History of Hyperlipidemia treated with  Rosuvastatin  20 mg nightly. 09/16/2023 Lipid Panel: WNL. 12/2021 Coronary Calcium  Score: 1192.  History of Obesity; BMI 30+, today 34.78, weight 279 lbs. Starting in 05-Oct-2012, he was 259 pounds in February, ended at 264 lbs in October. In 02/2014 was 261 lbs and by November was 268 pounds. From April 2017 went from 271 pounds 8 oz to 280 lbs in 01/2016. By 07/2016 was 272 lbs, ending at 278 pounds by 10/2016. In 08/2017 was 291 pounds and by 02/2018 was 285 lbs. From 03/2019 went from 290 pounds to 300 pounds in 05/2020. His highest weight was 319 pounds 8 oz in Oct 05, 2021, which decreased to 312 lbs in September. In 05/25/2024was 281 pounds 1.9 oz and was 276 by 03/2023. Says that he would like to lose weight, had been  taking something but lost coverage,  and recently his wife has started going to a weight loss clinic in [facility] recently  History of Osteoarthritis, Left Hip S/p Left Hip Arthroplasty by Dr. Lucienne Ryder in June 2018; History of Osteoarthritis, Bilateral Knees S/p Right Knee Arthroplasty by Dr. Sulema Endo in December 2024 - says that his ROM is better though not fully normal. Musculoskeletal Pain treated with Voltaren  75 mg twice daily.   Labs 09/16/2023 CBC: WNL CMP: WNL  HgbA1c: 5.5  Colonoscopy discussed.  PSA  0.15  09/16/2023  Vaccine Counseling: Due for Shingles 1/2 - discussed; UTD on Flu and Tdap. Past Medical History:  Diagnosis Date   Arthritis    Atrial flutter with rapid ventricular response (HCC)    GERD (gastroesophageal reflux disease)    History of hiatal hernia    Hyperlipidemia    Hypertension    Obesity    Medical/Surgical History Narrative:  Allergic/Intolerant to: Bee Venom - anaphylaxis; IVP Dye/Iodinated Contrast Media - hives & SOB; Shellfish - throat itching, relieved with Benadryl   10/06/2022 - in late June was attacked by a large bull, gored in his left abdominal wall, and had considerable bruising of his lower extremities. Was seen in ED at [facility] in [place].   10/06/2006 - Left Lower Lobe Pneumonia Family History  Problem Relation Age of Onset   Hypertension Mother    Family History Narrative: Father, deceased in 2021/10/05, w/ hx of MI, Coronary Artery Disease, Hypertension, Hx of Melanoma, Morbid Obesity, Sleep Apnea, Lumbar Spinal Stenosis, and Dementia  Mother, living, w/ hx of Bigeminy, Diabetes Mellitus, Hypertension, Hyperlipidemia, and Obesity Sister w/ hx of Hypertension and Migraine Headaches Social History   Social History Narrative    2025 - Divorced. Resides with male partner. 2 adult daughters, grandchildren. He is a Leisure centre manager. Non-smoker, minimal daily alcohol consumption - enjoys tequila.   Review of Systems  Constitutional:  Negative for  chills, fever, malaise/fatigue and weight loss.  HENT:  Negative for hearing loss, sinus pain and sore throat.   Respiratory:  Negative for cough, hemoptysis and shortness of breath.   Cardiovascular:  Negative for chest pain, palpitations, leg swelling and PND.  Gastrointestinal:  Negative for abdominal pain, constipation, diarrhea, heartburn, nausea and vomiting.  Genitourinary:  Negative for dysuria, frequency and urgency.  Musculoskeletal:  Negative for back pain, myalgias and neck pain.  Skin:  Negative for itching and rash.  Neurological:  Negative for dizziness, tingling, seizures and headaches.  Endo/Heme/Allergies:  Negative for polydipsia.  Psychiatric/Behavioral:  Negative for depression. The patient is not nervous/anxious.     Objective:  Vitals: BP 130/80   Pulse 68   Ht 6\' 3"  (1.905 m)   Wt 279 lb (126.6 kg)   SpO2 98%   BMI 34.87 kg/m  Physical Exam Vitals and nursing note reviewed.  Constitutional:      General: He is awake. He is not in acute distress.    Appearance: Normal appearance. He is not ill-appearing or toxic-appearing.  HENT:     Head: Normocephalic and atraumatic.     Right Ear: Tympanic membrane, ear canal and external ear normal.     Left Ear: Tympanic membrane, ear canal and external ear normal.     Mouth/Throat:     Pharynx: Oropharynx is clear.  Eyes:     Extraocular Movements: Extraocular movements intact.     Pupils: Pupils are equal, round, and reactive to light.  Neck:     Thyroid: No thyroid mass, thyromegaly or thyroid tenderness.     Vascular: No carotid bruit.  Cardiovascular:     Rate and Rhythm: Normal rate and regular rhythm. No extrasystoles are present.    Pulses:          Dorsalis pedis pulses are 1+ on the right side and 1+ on the left side.     Heart sounds: Normal heart sounds. No murmur heard.    No friction rub. No gallop.  Pulmonary:     Effort: Pulmonary effort is normal.     Breath sounds: Normal breath sounds. No  decreased breath sounds, wheezing, rhonchi or rales.  Chest:     Chest wall: No mass.  Abdominal:     Palpations: Abdomen is soft. There is no hepatomegaly, splenomegaly or mass.     Tenderness: There is no abdominal tenderness.     Hernia: No hernia is present.  Genitourinary:    Prostate: Normal. Not enlarged, not tender and no nodules present.  Musculoskeletal:     Cervical back: Normal range of motion.     Right lower leg: No edema.     Left lower leg: No edema.  Lymphadenopathy:     Cervical: No cervical adenopathy.     Upper Body:     Right upper body: No supraclavicular adenopathy.     Left upper body: No supraclavicular adenopathy.  Skin:    General: Skin is warm and dry.  Neurological:     General: No focal deficit present.     Mental Status: He is alert and oriented  to person, place, and time. Mental status is at baseline.     Cranial Nerves: Cranial nerves 2-12 are intact.     Sensory: Sensation is intact.     Motor: Motor function is intact.     Coordination: Coordination is intact.     Gait: Gait is intact.     Deep Tendon Reflexes: Reflexes are normal and symmetric.  Psychiatric:        Attention and Perception: Attention normal.        Mood and Affect: Mood normal.        Speech: Speech normal.        Behavior: Behavior normal. Behavior is cooperative.        Thought Content: Thought content normal.        Cognition and Memory: Cognition and memory normal.        Judgment: Judgment normal.   Most Recent Fall Risk Assessment:    09/18/2022    2:17 PM  Fall Risk   Falls in the past year? 0  Number falls in past yr: 0  Injury with Fall? 0  Risk for fall due to : No Fall Risks  Follow up Falls prevention discussed   Most Recent Depression Screenings:    09/24/2023    2:28 PM 09/18/2022    2:17 PM  PHQ 2/9 Scores  PHQ - 2 Score 0 0   Results:  Studies Obtained And Personally Reviewed By Me:  12/2021 Coronary Calcium  Score: 1192.  Labs:     Component  Value Date/Time   NA 138 09/16/2023 1049   K 4.6 09/16/2023 1049   CL 104 09/16/2023 1049   CO2 27 09/16/2023 1049   GLUCOSE 98 09/16/2023 1049   BUN 16 09/16/2023 1049   CREATININE 0.70 09/16/2023 1049   CALCIUM  9.0 09/16/2023 1049   PROT 7.0 09/16/2023 1049   ALBUMIN 3.9 08/07/2016 1029   AST 14 09/16/2023 1049   ALT 16 09/16/2023 1049   ALKPHOS 84 08/07/2016 1029   BILITOT 0.4 09/16/2023 1049   GFRNONAA 100 06/13/2020 1121   GFRAA 116 06/13/2020 1121    Lab Results  Component Value Date   WBC 5.4 09/16/2023   HGB 15.4 09/16/2023   HCT 45.6 09/16/2023   MCV 93.6 09/16/2023   PLT 179 09/16/2023   Lab Results  Component Value Date   CHOL 177 09/16/2023   HDL 56 09/16/2023   LDLCALC 97 09/16/2023   TRIG 139 09/16/2023   CHOLHDL 3.2 09/16/2023   Lab Results  Component Value Date   HGBA1C 5.5 09/16/2023    Lab Results  Component Value Date   TSH 1.53 01/27/2016    Lab Results  Component Value Date   PSA 0.15 09/16/2023   PSA 0.19 03/18/2023   PSA 0.13 09/11/2021     Assessment & Plan:   Orders Placed This Encounter  Procedures   POCT URINALYSIS DIP (CLINITEK)  Other Labs Reviewed today: CBC: WNL CMP: WNL  HgbA1c: 5.5  Hypertension treated with Losartan  100 mg daily. Blood Pressure: normotensive today at 130/80. In March he was seen in ED for Hypertension, which he believes was contributed by multiple compounding factors: use of decongestants x6 days prior, his weight, and his busy schedule over the past week.   History of Afib w/ RVR in 2006 - was hospitalized and treated with IV Cardizem & rhythm subsequently converted to sinus rhythm; cardiac cath showed no significant coronary artery disease and has not had a further episode nor  required medication.   Hyperlipidemia treated with  Rosuvastatin  20 mg nightly. 09/16/2023 Lipid Panel: WNL. 12/2021 Coronary Calcium  Score: 1192.  Obesity; BMI 30+, today 34.78, weight 279 lbs. Says that he would like to lose  weight, had been taking something but lost coverage,  and recently his wife has started going to a weight loss clinic in [facility] recently  Osteoarthritis, Left Hip S/p Left Hip Arthroplasty by Dr. Lucienne Ryder in June 2018. Osteoarthritis, Bilateral Knees S/p Right Knee Arthroplasty by Dr. Sulema Endo in December 2024 - says that his ROM is better though not fully normal. Musculoskeletal Pain treated with Voltaren  75 mg twice daily.   Colonoscopy discussed.  PSA  0.15  09/16/2023  Vaccine Counseling: Due for Shingles 1/2 - discussed; UTD on Flu and Tdap.  Return in 6 months (on 03/30/2024) for 6 month f/u, HgbA1c recheck.   Annual wellness visit done today including the all of the following: Reviewed patient's Family Medical History Reviewed and updated list of patient's medical providers Assessment of cognitive impairment was done Assessed patient's functional ability Established a written schedule for health screening services Health Risk Assessent Completed and Reviewed  Discussed health benefits of physical activity, and encouraged him to engage in regular exercise appropriate for his age and condition.    I,Emily Lagle,acting as a Neurosurgeon for Sylvan Evener, MD.,have documented all relevant documentation on the behalf of Sylvan Evener, MD,as directed by  Sylvan Evener, MD while in the presence of Sylvan Evener, MD.   I, Sylvan Evener, MD, have reviewed all documentation for this visit. The documentation on 09/25/23 for the exam, diagnosis, procedures, and orders are all accurate and complete.

## 2023-09-16 ENCOUNTER — Other Ambulatory Visit: Payer: BC Managed Care – PPO

## 2023-09-16 DIAGNOSIS — I10 Essential (primary) hypertension: Secondary | ICD-10-CM | POA: Diagnosis not present

## 2023-09-16 DIAGNOSIS — R7301 Impaired fasting glucose: Secondary | ICD-10-CM

## 2023-09-16 DIAGNOSIS — M170 Bilateral primary osteoarthritis of knee: Secondary | ICD-10-CM

## 2023-09-16 DIAGNOSIS — Z01818 Encounter for other preprocedural examination: Secondary | ICD-10-CM

## 2023-09-16 DIAGNOSIS — Z125 Encounter for screening for malignant neoplasm of prostate: Secondary | ICD-10-CM

## 2023-09-16 DIAGNOSIS — E782 Mixed hyperlipidemia: Secondary | ICD-10-CM | POA: Diagnosis not present

## 2023-09-17 ENCOUNTER — Ambulatory Visit: Payer: BC Managed Care – PPO | Admitting: Internal Medicine

## 2023-09-17 LAB — COMPLETE METABOLIC PANEL WITHOUT GFR
AG Ratio: 1.6 (calc) (ref 1.0–2.5)
ALT: 16 U/L (ref 9–46)
AST: 14 U/L (ref 10–35)
Albumin: 4.3 g/dL (ref 3.6–5.1)
Alkaline phosphatase (APISO): 92 U/L (ref 35–144)
BUN: 16 mg/dL (ref 7–25)
CO2: 27 mmol/L (ref 20–32)
Calcium: 9 mg/dL (ref 8.6–10.3)
Chloride: 104 mmol/L (ref 98–110)
Creat: 0.7 mg/dL (ref 0.70–1.30)
Globulin: 2.7 g/dL (ref 1.9–3.7)
Glucose, Bld: 98 mg/dL (ref 65–99)
Potassium: 4.6 mmol/L (ref 3.5–5.3)
Sodium: 138 mmol/L (ref 135–146)
Total Bilirubin: 0.4 mg/dL (ref 0.2–1.2)
Total Protein: 7 g/dL (ref 6.1–8.1)

## 2023-09-17 LAB — LIPID PANEL
Cholesterol: 177 mg/dL (ref <200)
HDL: 56 mg/dL (ref >=40)
LDL Cholesterol (Calc): 97 mg/dL
Non-HDL Cholesterol (Calc): 121 mg/dL (ref <130)
Total CHOL/HDL Ratio: 3.2 (calc) (ref <5.0)
Triglycerides: 139 mg/dL (ref <150)

## 2023-09-17 LAB — CBC WITH DIFFERENTIAL/PLATELET
Absolute Lymphocytes: 1485 {cells}/uL (ref 850–3900)
Absolute Monocytes: 459 {cells}/uL (ref 200–950)
Basophils Absolute: 32 {cells}/uL (ref 0–200)
Basophils Relative: 0.6 %
Eosinophils Absolute: 130 {cells}/uL (ref 15–500)
Eosinophils Relative: 2.4 %
HCT: 45.6 % (ref 38.5–50.0)
Hemoglobin: 15.4 g/dL (ref 13.2–17.1)
MCH: 31.6 pg (ref 27.0–33.0)
MCHC: 33.8 g/dL (ref 32.0–36.0)
MCV: 93.6 fL (ref 80.0–100.0)
MPV: 11.3 fL (ref 7.5–12.5)
Monocytes Relative: 8.5 %
Neutro Abs: 3294 {cells}/uL (ref 1500–7800)
Neutrophils Relative %: 61 %
Platelets: 179 10*3/uL (ref 140–400)
RBC: 4.87 10*6/uL (ref 4.20–5.80)
RDW: 12.9 % (ref 11.0–15.0)
Total Lymphocyte: 27.5 %
WBC: 5.4 10*3/uL (ref 3.8–10.8)

## 2023-09-17 LAB — HEMOGLOBIN A1C
Hgb A1c MFr Bld: 5.5 % (ref <5.7)
Mean Plasma Glucose: 111 mg/dL
eAG (mmol/L): 6.2 mmol/L

## 2023-09-17 LAB — PSA: PSA: 0.15 ng/mL (ref <=4.00)

## 2023-09-24 ENCOUNTER — Encounter: Payer: Self-pay | Admitting: Internal Medicine

## 2023-09-24 ENCOUNTER — Ambulatory Visit: Admitting: Internal Medicine

## 2023-09-24 VITALS — BP 130/80 | HR 68 | Ht 75.0 in | Wt 279.0 lb

## 2023-09-24 DIAGNOSIS — Z0000 Encounter for general adult medical examination without abnormal findings: Secondary | ICD-10-CM | POA: Diagnosis not present

## 2023-09-24 DIAGNOSIS — Z01818 Encounter for other preprocedural examination: Secondary | ICD-10-CM

## 2023-09-24 DIAGNOSIS — Z6834 Body mass index (BMI) 34.0-34.9, adult: Secondary | ICD-10-CM

## 2023-09-24 DIAGNOSIS — Z96642 Presence of left artificial hip joint: Secondary | ICD-10-CM | POA: Diagnosis not present

## 2023-09-24 DIAGNOSIS — I10 Essential (primary) hypertension: Secondary | ICD-10-CM | POA: Diagnosis not present

## 2023-09-24 DIAGNOSIS — Z96651 Presence of right artificial knee joint: Secondary | ICD-10-CM

## 2023-09-24 LAB — POCT URINALYSIS DIP (CLINITEK)
Bilirubin, UA: NEGATIVE
Blood, UA: NEGATIVE
Glucose, UA: NEGATIVE mg/dL
Ketones, POC UA: NEGATIVE mg/dL
Leukocytes, UA: NEGATIVE
Nitrite, UA: NEGATIVE
POC PROTEIN,UA: NEGATIVE
Spec Grav, UA: 1.01 (ref 1.010–1.025)
Urobilinogen, UA: 0.2 U/dL
pH, UA: 6 (ref 5.0–8.0)

## 2023-09-29 ENCOUNTER — Encounter: Payer: Self-pay | Admitting: Internal Medicine

## 2023-09-29 NOTE — Patient Instructions (Addendum)
 It was a pleasure to see you today. Continue diet and exercise efforts. Continue current medications. Return in 6 months for follow up

## 2023-11-17 ENCOUNTER — Other Ambulatory Visit: Payer: Self-pay | Admitting: Internal Medicine

## 2023-11-28 DIAGNOSIS — Z96651 Presence of right artificial knee joint: Secondary | ICD-10-CM | POA: Diagnosis not present

## 2023-11-28 DIAGNOSIS — Z09 Encounter for follow-up examination after completed treatment for conditions other than malignant neoplasm: Secondary | ICD-10-CM | POA: Diagnosis not present

## 2024-03-30 ENCOUNTER — Ambulatory Visit: Payer: Self-pay | Admitting: Internal Medicine

## 2024-03-31 ENCOUNTER — Ambulatory Visit (INDEPENDENT_AMBULATORY_CARE_PROVIDER_SITE_OTHER): Admitting: Internal Medicine

## 2024-03-31 ENCOUNTER — Encounter: Payer: Self-pay | Admitting: Internal Medicine

## 2024-03-31 VITALS — BP 130/90 | HR 76 | Ht 75.0 in | Wt 286.0 lb

## 2024-03-31 DIAGNOSIS — I10 Essential (primary) hypertension: Secondary | ICD-10-CM

## 2024-03-31 DIAGNOSIS — Z01818 Encounter for other preprocedural examination: Secondary | ICD-10-CM

## 2024-03-31 DIAGNOSIS — E669 Obesity, unspecified: Secondary | ICD-10-CM | POA: Diagnosis not present

## 2024-03-31 DIAGNOSIS — Z96642 Presence of left artificial hip joint: Secondary | ICD-10-CM

## 2024-03-31 DIAGNOSIS — Z6835 Body mass index (BMI) 35.0-35.9, adult: Secondary | ICD-10-CM

## 2024-03-31 DIAGNOSIS — Z96651 Presence of right artificial knee joint: Secondary | ICD-10-CM

## 2024-03-31 DIAGNOSIS — E785 Hyperlipidemia, unspecified: Secondary | ICD-10-CM | POA: Diagnosis not present

## 2024-03-31 MED ORDER — TRIAMCINOLONE ACETONIDE 0.1 % EX OINT: 1.0000 | TOPICAL_OINTMENT | Freq: Two times a day (BID) | CUTANEOUS | 1 refills | Status: AC

## 2024-03-31 MED ORDER — OLMESARTAN MEDOXOMIL 20 MG PO TABS: 20.0000 mg | ORAL_TABLET | Freq: Every day | ORAL | 1 refills | Status: DC

## 2024-03-31 NOTE — Progress Notes (Addendum)
 Patient Care Team: Perri Ronal PARAS, MD as PCP - General (Internal Medicine) Lonni Slain, MD as PCP - Cardiology (Cardiology)  Visit Date: 03/31/24  Subjective:    Patient ID: John Garcia , Male   DOB: [birth year], 59 y.o.    MRN: [id number]   58 y.o. Male presents today for 6 month follow up for Hypertension, Hyperlipidemia. Patient has a past medical history of Unilateral Primary Osteoarthritis, Left Hip; Status Post Total Replacement, Left Hip; and Unilateral Primary Osteoarthritis, Right Knee.   History of Hypertension treated with Losartan  100 mg daily. Blood Pressure: elevated today at 160/100. History of Afib w/ RVR in 12-May-2005 - was hospitalized and treated with IV Cardizem & rhythm subsequently converted to sinus rhythm; cardiac cath showed no significant coronary artery disease and has not had a further episode nor required medication. Says that in March he was seen in ED for Hypertension, which he believes was contributed by multiple compounding factors: use of decongestants x6 days prior, his weight, and his busy schedule over the past week. Blood pressure has not been as well controlled on losartan . He has a family history of hypertension.    History of Hyperlipidemia treated with Rosuvastatin  20 mg nightly. 01/08/2022 Coronary calcium  score 1192. 09/16/23 Lipid panel normal.  History of Obesity. Current weight 286 lb BMI 35.75. He has gained 7 pounds since his last visit on 09/24/2023.  History of Osteoarthritis, Left Hip S/p Left Hip Arthroplasty by Dr. Vernetta in June 2018; History of Osteoarthritis, Bilateral Knees S/p Right Knee Arthroplasty by Dr. Georgina in 2023/05/13- says that his ROM is better though not fully normal. Musculoskeletal Pain treated with Voltaren  75 mg twice daily.     Health maintenance: colonoscopy dicussed.    Past Medical History:  Diagnosis Date   Arthritis    Atrial flutter with rapid ventricular response (HCC)    GERD  (gastroesophageal reflux disease)    History of hiatal hernia    Hyperlipidemia    Hypertension    Obesity      Family History  Problem Relation Age of Onset   Hypertension Mother    Father, deceased in 05-12-22, w/ hx of MI, Coronary Artery Disease, Hypertension, Hx of Melanoma, Morbid Obesity, Sleep Apnea, Lumbar Spinal Stenosis, and Dementia Mother, living, w/ hx of Bigeminy, Diabetes Mellitus, Hypertension, Hyperlipidemia, and Obesity Sister w/ hx of Hypertension and Migraine Headaches   Social History   Social History Narrative    2025 - Divorced. Resides with male partner. 2 adult daughters, grandchildren. He is a leisure centre manager. Non-smoker, minimal daily alcohol consumption - enjoys tequila.      Review of Systems  All other systems reviewed and are negative.       Objective:   Vitals: BP (!) 130/90   Pulse 76   Ht 6' 3 (1.905 m)   Wt 286 lb (129.7 kg)   SpO2 96%   BMI 35.75 kg/m    Physical Exam Vitals and nursing note reviewed.     Chest is clear. Cor: RRR without ectopy. No LE edema.  Results:    Labs:       Component Value Date/Time   NA 138 09/16/2023 1049   K 4.6 09/16/2023 1049   CL 104 09/16/2023 1049   CO2 27 09/16/2023 1049   GLUCOSE 98 09/16/2023 1049   BUN 16 09/16/2023 1049   CREATININE 0.70 09/16/2023 1049   CALCIUM  9.0 09/16/2023 1049   PROT 7.0 09/16/2023 1049   ALBUMIN  3.9 08/07/2016 1029   AST 14 09/16/2023 1049   ALT 16 09/16/2023 1049   ALKPHOS 84 08/07/2016 1029   BILITOT 0.4 09/16/2023 1049   GFRNONAA 100 06/13/2020 1121   GFRAA 116 06/13/2020 1121     Lab Results  Component Value Date   WBC 5.4 09/16/2023   HGB 15.4 09/16/2023   HCT 45.6 09/16/2023   MCV 93.6 09/16/2023   PLT 179 09/16/2023    Lab Results  Component Value Date   CHOL 177 09/16/2023   HDL 56 09/16/2023   LDLCALC 97 09/16/2023   TRIG 139 09/16/2023   CHOLHDL 3.2 09/16/2023    Lab Results  Component Value Date   HGBA1C 5.5 09/16/2023      Lab Results  Component Value Date   TSH 1.53 01/27/2016     Lab Results  Component Value Date   PSA 0.15 09/16/2023   PSA 0.19 03/18/2023   PSA 0.13 09/11/2021     Assessment & Plan:   Meds ordered this encounter  Medications   olmesartan  (BENICAR ) 20 MG tablet    Sig: Take 1 tablet (20 mg total) by mouth daily.    Dispense:  90 tablet    Refill:  1   triamcinolone  ointment (KENALOG ) 0.1 %    Sig: Apply 1 Application topically 2 (two) times daily.    Dispense:  30 g    Refill:  1    Hypertension: treated with Losartan  100 mg daily. Blood Pressure: elevated today at 160/100. History of Afib w/ RVR in 2006 - was hospitalized and treated with IV Cardizem & rhythm subsequently converted to sinus rhythm; cardiac cath showed no significant coronary artery disease and has not had a further episode nor required medication. Says that in March he was seen in ED for Hypertension, which he believes was contributed by multiple compounding factors: use of decongestants x6 days prior, his weight, and his busy schedule over the past week. Blood pressure has not been as well controlled on Losartan . He has a family history of hypertension.    Losartan  100 mg discontinued. Benicar  20 mg daily prescribed. Monitor BP at home and let us  know if not well controlled. Follow up in January 2026. Busy time of year for him currently.   Hyperlipidemia: treated with Rosuvastatin  20 mg nightly.  01/03/2022 Coronary calcium  score 1192. 09/16/23 Lipid panel normal.Continue statin medication. Discussion of this score and need for statin medication.  Obesity: Current weight 286 lb BMI 35.75. He has gained 7 pounds since his last visit on 09/24/2023.  Osteoarthritis: Left Hip S/p Left Hip Arthroplasty by Dr. Vernetta in June 2018; History of Osteoarthritis, Bilateral Knees S/p Right Knee Arthroplasty by Dr. Georgina in December 2024 - says that his ROM is better though not fully normal. Musculoskeletal Pain treated  with Voltaren  75 mg twice daily.     Health maintenance: Colonoscopy dicussed.    I,Makayla C Reid,acting as a scribe for Ronal JINNY Hailstone, MD.,have documented all relevant documentation on the behalf of Ronal JINNY Hailstone, MD,as directed by  Ronal JINNY Hailstone, MD while in the presence of Ronal JINNY Hailstone, MD.  I, Ronal JINNY Hailstone, MD, have reviewed all documentation for this visit. The documentation on 03/31/2024 for the exam, diagnosis, procedures, and orders are all accurate and complete.

## 2024-04-11 ENCOUNTER — Encounter: Payer: Self-pay | Admitting: Internal Medicine

## 2024-04-11 NOTE — Patient Instructions (Signed)
 Continue Rosuvastatin  daily. You have a high coronary calcium  score and this is best treated with a statin medication. We can refer you to Cardiology for evaluation when you have time. Follow up here in January. Losartan  discontinued as BP not well controlled on it. Start Benicar  20 mg daily.

## 2024-05-18 ENCOUNTER — Other Ambulatory Visit: Payer: Self-pay | Admitting: Internal Medicine

## 2024-05-18 NOTE — Telephone Encounter (Signed)
 Copied from CRM [id number]. Topic: Clinical - Medication Refill >> May 18, 2024 11:27 AM Vanessa G wrote: Medication: olmesartan  (BENICAR ) 20 MG tablet  Has the patient contacted their pharmacy? No (Agent: If no, request that the patient contact the pharmacy for the refill. If patient does not wish to contact the pharmacy document the reason why and proceed with request.) (Agent: If yes, when and what did the pharmacy advise?)  This is the patient's preferred pharmacy:  [id number] [place] - [address] [place] [address] Phone: [phone] Fax: [phone]  Is this the correct pharmacy for this prescription? Yes If no, delete pharmacy and type the correct one.   Has the prescription been filled recently? No  Is the patient out of the medication? No  Has the patient been seen for an appointment in the last year OR does the patient have an upcoming appointment? Yes  Can we respond through MyChart? No  Agent: Please be advised that Rx refills may take up to 3 business days. We ask that you follow-up with your pharmacy.  Caller stated that prescribing provider adv patient that if the med was not working at the dosage amt of 20mg  then it could be increased to 40mg , caller stated that patient tried to reach provider to get approval to increase to 40mg  but he was never able to get in contact with provider so he just increased it himself to 40mg  causing him to use the quantity of the amt of the meds faster, patient is req that new prescription that is sent in be for 40mg  rather than 20mg .

## 2024-05-19 ENCOUNTER — Other Ambulatory Visit: Payer: Self-pay | Admitting: Internal Medicine

## 2024-05-19 ENCOUNTER — Telehealth: Payer: Self-pay | Admitting: Internal Medicine

## 2024-05-19 MED ORDER — OLMESARTAN MEDOXOMIL 20 MG PO TABS: 20.0000 mg | ORAL_TABLET | Freq: Every day | ORAL | 1 refills | Status: DC

## 2024-05-19 NOTE — Telephone Encounter (Signed)
 Copied from CRM [id number]. Topic: Clinical - Medication Question >> May 19, 2024 12:50 PM [name] wrote: Reason for CRM: Caller called in to check status of med refill req for med, olmesartan  (BENICAR ) 20 MG tablet, adv caller that med req refill was approved and sent to pharmacy on today @ 9:00am, confirmed med was sent to correct pharmacy, adv patient that it was sent with dosage instructions of one 20mg  tablet a day, caller stated that prescribing provider previously adv patient that if the med was not working at the dosage amt of 20mg  then it could be increased to 40mg , caller stated that patient tried to reach provider to get approval to increase to 40mg  but he was never able to get in contact with provider so he just increased it himself to 40mg , caller is req prescription that is sent in be for 40mg  rather than 20mg , caller is req a call back to confirm this is done.

## 2024-05-19 NOTE — Telephone Encounter (Signed)
 Called patient no answer, he needs an office to discuss rx change/increase.

## 2024-05-21 ENCOUNTER — Telehealth: Payer: Self-pay

## 2024-05-21 ENCOUNTER — Other Ambulatory Visit: Payer: Self-pay

## 2024-05-21 DIAGNOSIS — I10 Essential (primary) hypertension: Secondary | ICD-10-CM

## 2024-05-21 MED ORDER — OLMESARTAN MEDOXOMIL 40 MG PO TABS: 40.0000 mg | ORAL_TABLET | Freq: Every day | ORAL | 0 refills | Status: DC

## 2024-05-21 NOTE — Telephone Encounter (Signed)
 Patient increased himself benicar  from 20mg  to 40mg  daily, they want a prescription sent for 40mg . He has an appointment to come in to discuss on 1/20, but he is insisting on a refill for 40 mg daily. Please advise.

## 2024-05-21 NOTE — Telephone Encounter (Signed)
 Done

## 2024-05-21 NOTE — Telephone Encounter (Signed)
 Copied from CRM [id number]. Topic: Clinical - Medication Question >> May 19, 2024 12:50 PM [name] wrote: Reason for CRM: Caller called in to check status of med refill req for med, olmesartan  (BENICAR ) 20 MG tablet, adv caller that med req refill was approved and sent to pharmacy on today @ 9:00am, confirmed med was sent to correct pharmacy, adv patient that it was sent with dosage instructions of one 20mg  tablet a day, caller stated that prescribing provider previously adv patient that if the med was not working at the dosage amt of 20mg  then it could be increased to 40mg , caller stated that patient tried to reach provider to get approval to increase to 40mg  but he was never able to get in contact with provider so he just increased it himself to 40mg , caller is req prescription that is sent in be for 40mg  rather than 20mg , caller is req a call back to confirm this is done. >> May 21, 2024  8:24 AM Montie POUR wrote: John Garcia is calling back to discuss the change in olmesartan  (BENICAR )  from a 20 MG tablet to a 40 MG tablet. I tried to transfer her to clinic and no answer. She is upset because John Garcia is out of his medication. Please call John Garcia at [phone] to discuss when new medication will be sent to him pharmacy. Thanks

## 2024-05-25 ENCOUNTER — Ambulatory Visit: Admitting: Internal Medicine

## 2024-05-25 ENCOUNTER — Encounter: Payer: Self-pay | Admitting: Internal Medicine

## 2024-05-25 VITALS — BP 100/70 | HR 64 | Ht 75.0 in | Wt 286.0 lb

## 2024-05-25 DIAGNOSIS — I10 Essential (primary) hypertension: Secondary | ICD-10-CM | POA: Diagnosis not present

## 2024-05-25 DIAGNOSIS — E785 Hyperlipidemia, unspecified: Secondary | ICD-10-CM | POA: Diagnosis not present

## 2024-05-25 LAB — BASIC METABOLIC PANEL WITH GFR
BUN: 18 mg/dL (ref 7–25)
CO2: 24 mmol/L (ref 20–32)
Calcium: 8.9 mg/dL (ref 8.6–10.3)
Chloride: 102 mmol/L (ref 98–110)
Creat: 0.72 mg/dL (ref 0.70–1.30)
Glucose, Bld: 87 mg/dL (ref 65–99)
Potassium: 4.4 mmol/L (ref 3.5–5.3)
Sodium: 136 mmol/L (ref 135–146)
eGFR: 107 mL/min/1.73m2

## 2024-05-25 NOTE — Patient Instructions (Addendum)
 Blood pressure is much improved on Olmesartan  40 mg daily. Continue with this medication and follow up in May 2026 for labs and health maintenance exam.

## 2024-05-25 NOTE — Progress Notes (Signed)
 "   Patient Care Team: Perri Ronal PARAS, MD as PCP - General (Internal Medicine) Lonni Slain, MD as PCP - Cardiology (Cardiology)  Visit Date: 05/25/2024  Subjective:    Patient ID: John Garcia , Male   DOB: 07-19-1965, 59 y.o.    MRN: [id number]   59 y.o. Male presents today for Hypertension. Patient has a past medical history of Hyperlipidemia, Obesity, osteoarthritis.  History of Hypertension currently treated with Olmesartan  40 mg daily. Blood pressure is normal at 100/70. He was previously on Losartan  20 mg but it was discontinued as his blood pressure was not well controlled.  Started on olmesartan   20 mg daily and pt increased it to 40 mg daily to get better control. He feels well on this medication with no side effects. Today, Blood pressure was 110/80 on recheck.   History of Hyperlipidemia treated with Rosuvastatin  20 mg. Lipid panel in May was entirely normal.   Past Medical History:  Diagnosis Date   Arthritis    Atrial flutter with rapid ventricular response (HCC)    GERD (gastroesophageal reflux disease)    History of hiatal hernia    Hyperlipidemia    Hypertension    Obesity      Family History  Problem Relation Age of Onset   Hypertension Mother     Social History   Social History Narrative    2025 - Divorced. Resides with male partner. 2 adult daughters, grandchildren. He is a leisure centre manager. Non-smoker, minimal daily alcohol consumption - enjoys tequila.      Review of Systems  All other systems reviewed and are negative.       Objective:   Vitals: BP 100/70   Pulse 64   Ht 6' 3 (1.905 m)   Wt 286 lb (129.7 kg)   SpO2 97%   BMI 35.75 kg/m  Rechecked and BP was 110/80. No symptoms with this BP  Physical Exam Vitals and nursing note reviewed.     Chest is clear to auscultation. Cor:RRR without ectopy. No LE edema.  Results:    Labs:       Component Value Date/Time   NA 138 09/16/2023 1049   K 4.6 09/16/2023 1049    CL 104 09/16/2023 1049   CO2 27 09/16/2023 1049   GLUCOSE 98 09/16/2023 1049   BUN 16 09/16/2023 1049   CREATININE 0.70 09/16/2023 1049   CALCIUM  9.0 09/16/2023 1049   PROT 7.0 09/16/2023 1049   ALBUMIN 3.9 08/07/2016 1029   AST 14 09/16/2023 1049   ALT 16 09/16/2023 1049   ALKPHOS 84 08/07/2016 1029   BILITOT 0.4 09/16/2023 1049   GFRNONAA 100 06/13/2020 1121   GFRAA 116 06/13/2020 1121     Lab Results  Component Value Date   WBC 5.4 09/16/2023   HGB 15.4 09/16/2023   HCT 45.6 09/16/2023   MCV 93.6 09/16/2023   PLT 179 09/16/2023    Lab Results  Component Value Date   CHOL 177 09/16/2023   HDL 56 09/16/2023   LDLCALC 97 09/16/2023   TRIG 139 09/16/2023   CHOLHDL 3.2 09/16/2023    Lab Results  Component Value Date   HGBA1C 5.5 09/16/2023     Lab Results  Component Value Date   TSH 1.53 01/27/2016     Lab Results  Component Value Date   PSA 0.15 09/16/2023   PSA 0.19 03/18/2023   PSA 0.13 09/11/2021     Assessment & Plan:   Orders Placed This Encounter  Procedures  Basic Metabolic Panel     Essential Hypertension: treated with Olmesartan  40 mg daily. Blood pressure is normal at 100/70. He was previously on Losartan  20 mg but it was discontinued as his blood pressure was not well controlled,   Started on Olmesartan  20 mg daily in November. He recently increased Olmesartan  to 40 mg daily to obtain better control. BP well controlled Currently and he says he feels well on this dose so will continue with this.  BMP collected today to monitor K, BUN and Creatinine on this medication.  Hyperlipidemia: treated with Rosuvastatin  20 mg.daily. Lipid panel not checked with this appt.   RTC may 18 for CPE labs. He may call if any questions or concerns arise before then.  I,Makayla C Reid,acting as a scribe for Ronal JINNY Hailstone, MD.,have documented all relevant documentation on the behalf of Ronal JINNY Hailstone, MD,as directed by  Ronal JINNY Hailstone, MD while in the presence of  Ronal JINNY Hailstone, MD.   I, Ronal JINNY Hailstone, MD, have reviewed all documentation for this visit. The documentation on 05/25/2024 for the exam, diagnosis, procedures, and orders are all accurate and complete.    "

## 2024-05-26 ENCOUNTER — Ambulatory Visit: Payer: Self-pay | Admitting: Internal Medicine

## 2024-06-02 ENCOUNTER — Ambulatory Visit: Admitting: Internal Medicine

## 2024-06-13 ENCOUNTER — Other Ambulatory Visit: Payer: Self-pay | Admitting: Internal Medicine

## 2024-06-13 DIAGNOSIS — I10 Essential (primary) hypertension: Secondary | ICD-10-CM

## 2024-06-16 ENCOUNTER — Telehealth: Payer: Self-pay | Admitting: Internal Medicine

## 2024-06-16 DIAGNOSIS — I10 Essential (primary) hypertension: Secondary | ICD-10-CM

## 2024-06-16 MED ORDER — OLMESARTAN MEDOXOMIL 40 MG PO TABS: 40.0000 mg | ORAL_TABLET | Freq: Every day | ORAL | 0 refills | Status: AC

## 2024-06-16 NOTE — Telephone Encounter (Signed)
 Benicar  was refilled.

## 2024-09-28 ENCOUNTER — Other Ambulatory Visit

## 2024-09-29 ENCOUNTER — Encounter: Admitting: Internal Medicine
